# Patient Record
Sex: Male | Born: 1945 | Race: White | Hispanic: No | Marital: Married | Smoking: Current every day smoker
Health system: Southern US, Community
[De-identification: ages and names within clinical notes are randomized; demographics above are authoritative.]

## PROBLEM LIST (undated history)

## (undated) DIAGNOSIS — I219 Acute myocardial infarction, unspecified: Secondary | ICD-10-CM

---

## 2007-09-11 HISTORY — PX: CAROTID STENT: SHX1301

## 2012-02-05 ENCOUNTER — Emergency Department (HOSPITAL_COMMUNITY): Payer: 59

## 2012-02-05 ENCOUNTER — Other Ambulatory Visit: Payer: Self-pay

## 2012-02-05 ENCOUNTER — Emergency Department (HOSPITAL_COMMUNITY)
Admission: EM | Admit: 2012-02-05 | Discharge: 2012-02-05 | Disposition: A | Discharge status: Home / Self Care | Payer: 59 | Attending: Emergency Medicine | Admitting: Emergency Medicine

## 2012-02-05 ENCOUNTER — Encounter (HOSPITAL_COMMUNITY): Payer: Self-pay | Admitting: *Deleted

## 2012-02-05 DIAGNOSIS — I10 Essential (primary) hypertension: Secondary | ICD-10-CM | POA: Insufficient documentation

## 2012-02-05 DIAGNOSIS — R5381 Other malaise: Secondary | ICD-10-CM | POA: Insufficient documentation

## 2012-02-05 DIAGNOSIS — R5383 Other fatigue: Secondary | ICD-10-CM | POA: Insufficient documentation

## 2012-02-05 DIAGNOSIS — Z79899 Other long term (current) drug therapy: Secondary | ICD-10-CM | POA: Insufficient documentation

## 2012-02-05 DIAGNOSIS — I252 Old myocardial infarction: Secondary | ICD-10-CM | POA: Insufficient documentation

## 2012-02-05 DIAGNOSIS — R531 Weakness: Secondary | ICD-10-CM

## 2012-02-05 HISTORY — DX: Acute myocardial infarction, unspecified: I21.9

## 2012-02-05 LAB — COMPREHENSIVE METABOLIC PANEL
BUN: 35 mg/dL — ABNORMAL HIGH (ref 6–23)
Calcium: 9.5 mg/dL (ref 8.4–10.5)
GFR calc Af Amer: 47 mL/min — ABNORMAL LOW (ref >90)
Glucose, Bld: 103 mg/dL — ABNORMAL HIGH (ref 70–99)
Sodium: 137 mEq/L (ref 135–145)
Total Protein: 7.2 g/dL (ref 6.0–8.3)

## 2012-02-05 LAB — TROPONIN I
Troponin I: <0.3 ng/mL (ref <0.30)
Troponin I: <0.3 ng/mL (ref <0.30)

## 2012-02-05 LAB — CBC WITH DIFFERENTIAL/PLATELET
Eosinophils Absolute: 0.3 10*3/uL (ref 0.0–0.7)
Eosinophils Relative: 2 % (ref 0–5)
Lymphs Abs: 1.6 10*3/uL (ref 0.7–4.0)
MCH: 27 pg (ref 26.0–34.0)
MCHC: 32.1 g/dL (ref 30.0–36.0)
MCV: 83.9 fL (ref 78.0–100.0)
Monocytes Relative: 10 % (ref 3–12)
Platelets: 284 10*3/uL (ref 150–400)
RBC: 4.23 MIL/uL (ref 4.22–5.81)

## 2012-02-05 LAB — URINALYSIS, ROUTINE W REFLEX MICROSCOPIC
Nitrite: NEGATIVE
Specific Gravity, Urine: 1.021 (ref 1.005–1.030)
Urobilinogen, UA: 0.2 mg/dL (ref 0.0–1.0)

## 2012-02-05 LAB — OCCULT BLOOD, POC DEVICE: Fecal Occult Bld: NEGATIVE

## 2012-02-05 MED ORDER — SODIUM CHLORIDE 0.9 % IV BOLUS (SEPSIS)
1000.0000 mL | Freq: Once | INTRAVENOUS | Status: AC
  Administered 2012-02-05: 1000 mL via INTRAVENOUS

## 2012-02-05 NOTE — ED Notes (Signed)
Patient was at the parking lot when he felt dizzy, lightheadedness, diaphoretic, nausea, and hypotension, 90/60 per [facility], HR 64, RR 18. Pale and diaphoretic.  Last time he had this symptoms, patient had and MI and stent was placed.  A&O x 3, 20 gauge to LAC, 500 cc of NS fluids, lat BP pta was 110/70.

## 2012-02-05 NOTE — ED Notes (Signed)
Patient back from xray and placed back on monitor.

## 2012-02-05 NOTE — ED Notes (Signed)
Pt ambulatory leaving with wife and friend. Pt leaving with discharge instructions and verbalized understanding of instructions.

## 2012-02-05 NOTE — ED Notes (Signed)
Patient is unable to void at this time 

## 2012-02-05 NOTE — ED Provider Notes (Signed)
History     CSN: [id number]  Arrival date & time 02/05/12  9629   First MD Initiated Contact with Patient 02/05/12 0901      Chief Complaint  Patient presents with  . Near Syncope    (Consider location/radiation/quality/duration/timing/severity/associated sxs/prior treatment) Patient is a 66 y.o. male presenting with weakness. The history is provided by the patient (the pt felt weak and sweaty today). No language interpreter was used.  Weakness Primary symptoms do not include headaches, syncope, loss of consciousness or seizures. The symptoms began 1 to 2 hours ago. The symptoms are improving. The neurological symptoms are diffuse. The symptoms occurred after standing up.  Additional symptoms include weakness. Additional symptoms do not include hallucinations. Medical issues do not include seizures. Workup history does not include CT scan.    Past Medical History  Diagnosis Date  . MI (myocardial infarction)     Past Surgical History  Procedure Date  . Carotid stent 09/2007    History reviewed. No pertinent family history.  History  Substance Use Topics  . Smoking status: Current Everyday Smoker    Types: Cigarettes  . Smokeless tobacco: Not on file  . Alcohol Use: No      Review of Systems  Constitutional: Negative for fatigue.  HENT: Negative for congestion, sinus pressure and ear discharge.   Eyes: Negative for discharge.  Respiratory: Negative for cough.   Cardiovascular: Negative for chest pain and syncope.  Gastrointestinal: Negative for abdominal pain and diarrhea.  Genitourinary: Negative for frequency and hematuria.  Musculoskeletal: Negative for back pain.  Skin: Negative for rash.  Neurological: Positive for weakness. Negative for seizures, loss of consciousness and headaches.  Hematological: Negative.   Psychiatric/Behavioral: Negative for hallucinations.    Allergies  Review of patient's allergies indicates no known allergies.  Home Medications    Current Outpatient Rx  Name Route Sig Dispense Refill  . ASPIRIN EC 81 MG PO TBEC Oral Take 81 mg by mouth at bedtime.    Marland Kitchen CARVEDILOL 12.5 MG PO TABS Oral Take 12.5 mg by mouth 2 (two) times daily with a meal.    . LISINOPRIL 20 MG PO TABS Oral Take 20 mg by mouth daily.    Marland Kitchen LORATADINE 10 MG PO TABS Oral Take 10 mg by mouth daily as needed. For allergies    . ADULT MULTIVITAMIN W/MINERALS CH Oral Take 1 tablet by mouth at bedtime.    Marland Kitchen FISH OIL 1200 MG PO CAPS Oral Take 2 capsules by mouth at bedtime.    . OMEPRAZOLE 20 MG PO CPDR Oral Take 20 mg by mouth 2 (two) times daily.    . PSYLLIUM 0.52 G PO CAPS Oral Take 0.52 g by mouth 2 (two) times daily.    Marland Kitchen RANOLAZINE ER 500 MG PO TB12 Oral Take 1,000 mg by mouth 2 (two) times daily.    Marland Kitchen SIMVASTATIN 40 MG PO TABS Oral Take 40 mg by mouth at bedtime.    . TESTOSTERONE 50 MG/5GM TD GEL Transdermal Place 5 g onto the skin 2 (two) times daily.    . TRAMADOL HCL 50 MG PO TABS Oral Take 50 mg by mouth daily as needed. For pain    . VITAMIN C 500 MG PO TABS Oral Take 500 mg by mouth at bedtime.      BP 125/67  Pulse 68  Temp 97.1 F (36.2 C) (Oral)  Resp 18  SpO2 100%  Physical Exam  Constitutional: He is oriented to person, place, and  time. He appears well-developed.  HENT:  Head: Normocephalic and atraumatic.  Eyes: Conjunctivae and EOM are normal. No scleral icterus.  Neck: Neck supple. No thyromegaly present.  Cardiovascular: Normal rate and regular rhythm.  Exam reveals no gallop and no friction rub.   No murmur heard. Pulmonary/Chest: No stridor. He has no wheezes. He has no rales. He exhibits no tenderness.  Abdominal: He exhibits no distension. There is no tenderness. There is no rebound.  Musculoskeletal: Normal range of motion. He exhibits no edema.  Lymphadenopathy:    He has no cervical adenopathy.  Neurological: He is oriented to person, place, and time. Coordination normal.  Skin: No rash noted. No erythema.    Psychiatric: He has a normal mood and affect. His behavior is normal.    ED Course  Procedures (including critical care time)  Labs Reviewed  CBC WITH DIFFERENTIAL - Abnormal; Notable for the following:    WBC 12.4 (*)     Hemoglobin 11.4 (*)     HCT 35.5 (*)     RDW 17.9 (*)     Neutro Abs 9.2 (*)     Monocytes Absolute 1.2 (*)     All other components within normal limits  COMPREHENSIVE METABOLIC PANEL - Abnormal; Notable for the following:    Potassium 5.4 (*)     Glucose, Bld 103 (*)     BUN 35 (*)     Creatinine, Ser 1.68 (*)     Total Bilirubin 0.2 (*)     GFR calc non Af Amer 41 (*)     GFR calc Af Amer 47 (*)     All other components within normal limits  TROPONIN I  URINALYSIS, ROUTINE W REFLEX MICROSCOPIC  OCCULT BLOOD, POC DEVICE  TROPONIN I   Dg Chest 2 View  02/05/2012  *RADIOLOGY REPORT*  Clinical Data: Near-syncope.  History of hypertension and myocardial infarction.  CHEST - 2 VIEW  Comparison: 09/13/2007 portable chest.  Two-view examination 01/02/2005.  Findings: The heart size and mediastinal contours are stable. There is chronic central airway thickening without hyperinflation or confluent airspace opacity.  There is no pleural effusion. Lower thoracic osteophyte on the lateral view appears unchanged.  IMPRESSION: Stable chronic central airway thickening consistent with bronchitis.  No acute cardiopulmonary process.   Original Report Authenticated By: Gerrianne Scale, M.D.      1. Weakness      Date: 02/05/2012  Rate: 63  Rhythm: normal sinus rhythm  QRS Axis: normal  Intervals: normal  ST/T Wave abnormalities: normal  Conduction Disutrbances:none  Narrative Interpretation:   Old EKG Reviewed: none available    MDM         Benny Lennert, MD 02/05/12 1358

## 2012-02-05 NOTE — ED Notes (Signed)
Patient transported to X-ray 

## 2012-02-05 NOTE — ED Notes (Signed)
Per family, patient "has been losing blood and MD does not know where it is coming from."    Patient is scheduled to have colonoscopy in September.  Patient denies any bloody stools or dark/coffee ground stools.

## 2012-11-15 IMAGING — CR DG CHEST 2V
2 series · 2 of 2 positions shown · non-contrast
Comparison: 09/13/2007 portable chest.  Two-view examination
01/02/2005.

CLINICAL DATA: Near-syncope.  History of hypertension and
myocardial infarction.

CHEST - 2 VIEW

[w chest pa]
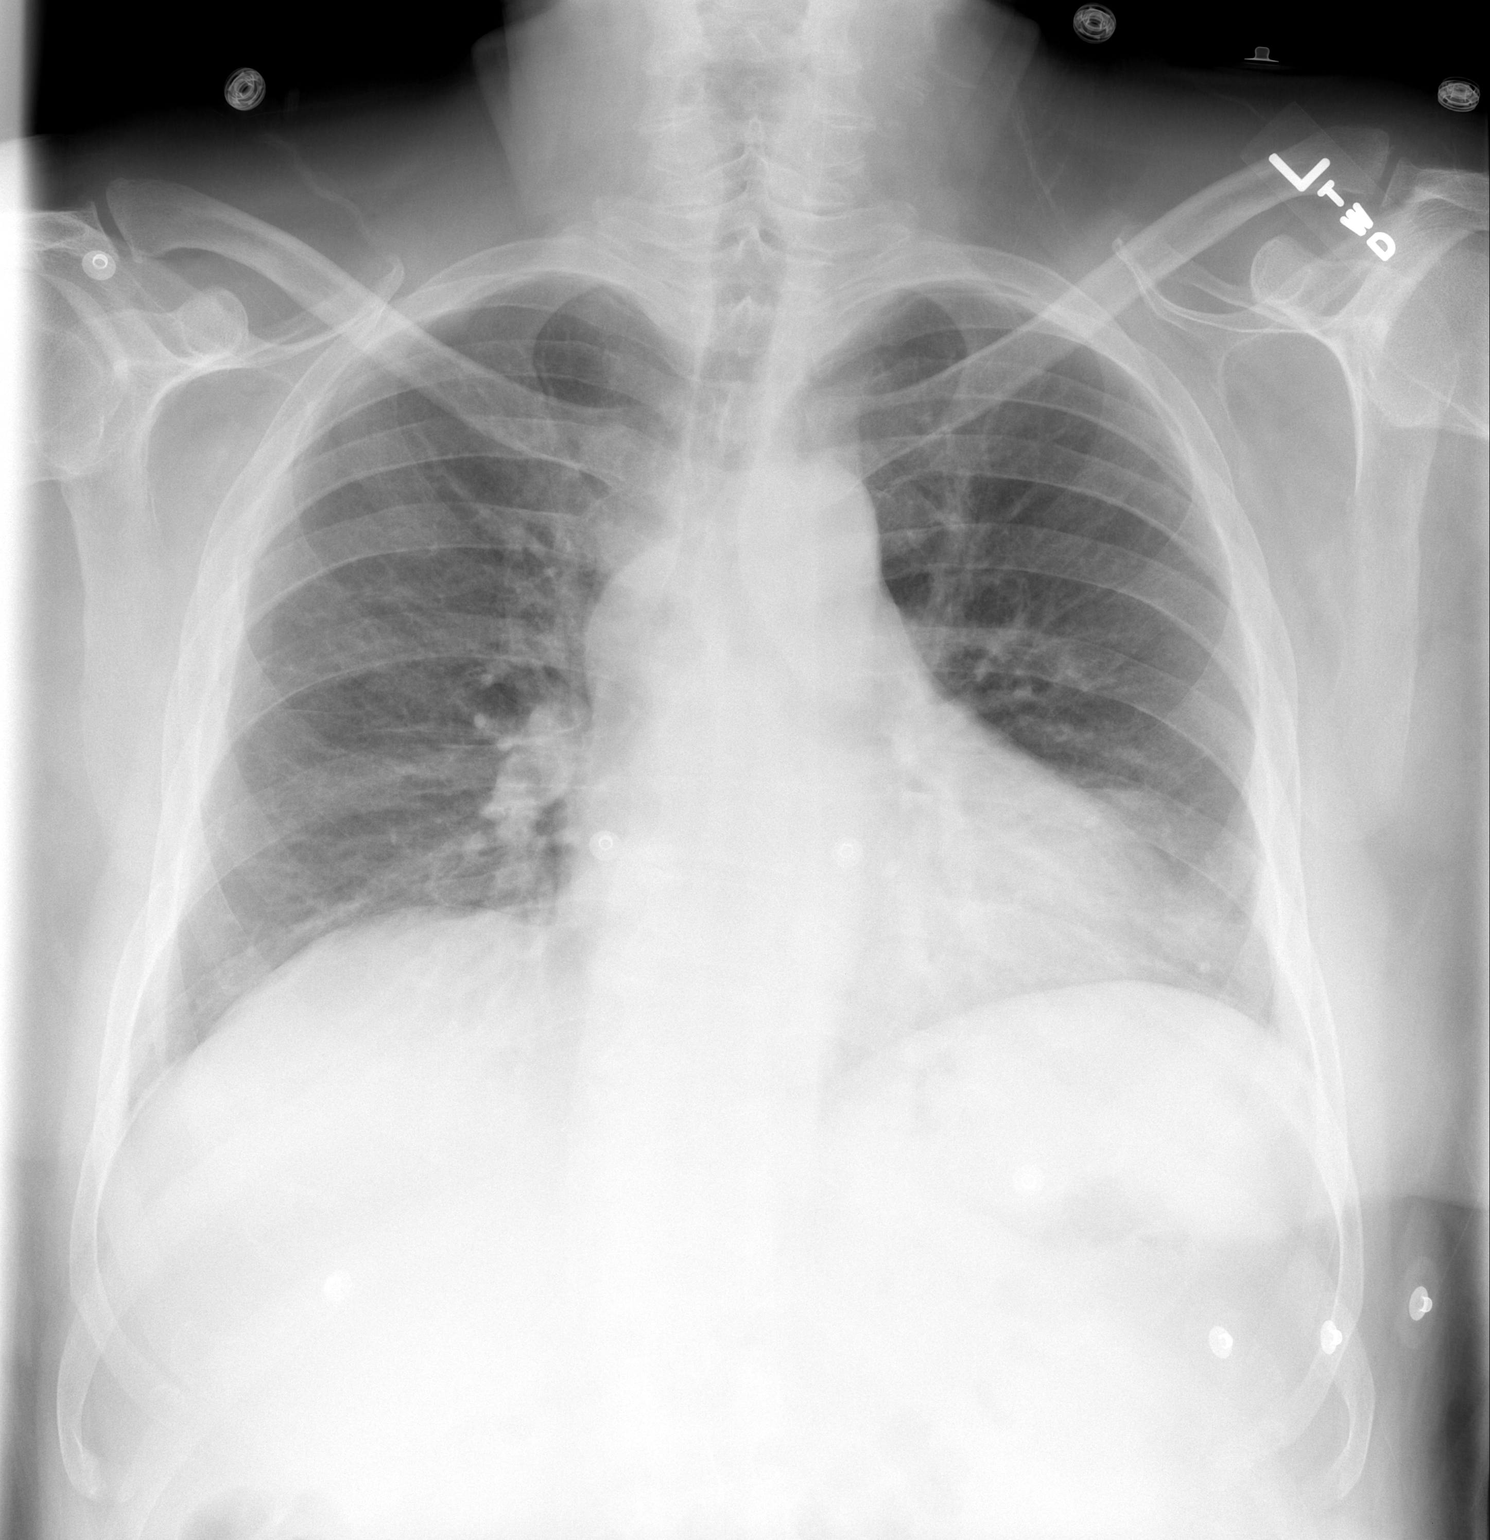

[w chest lat]
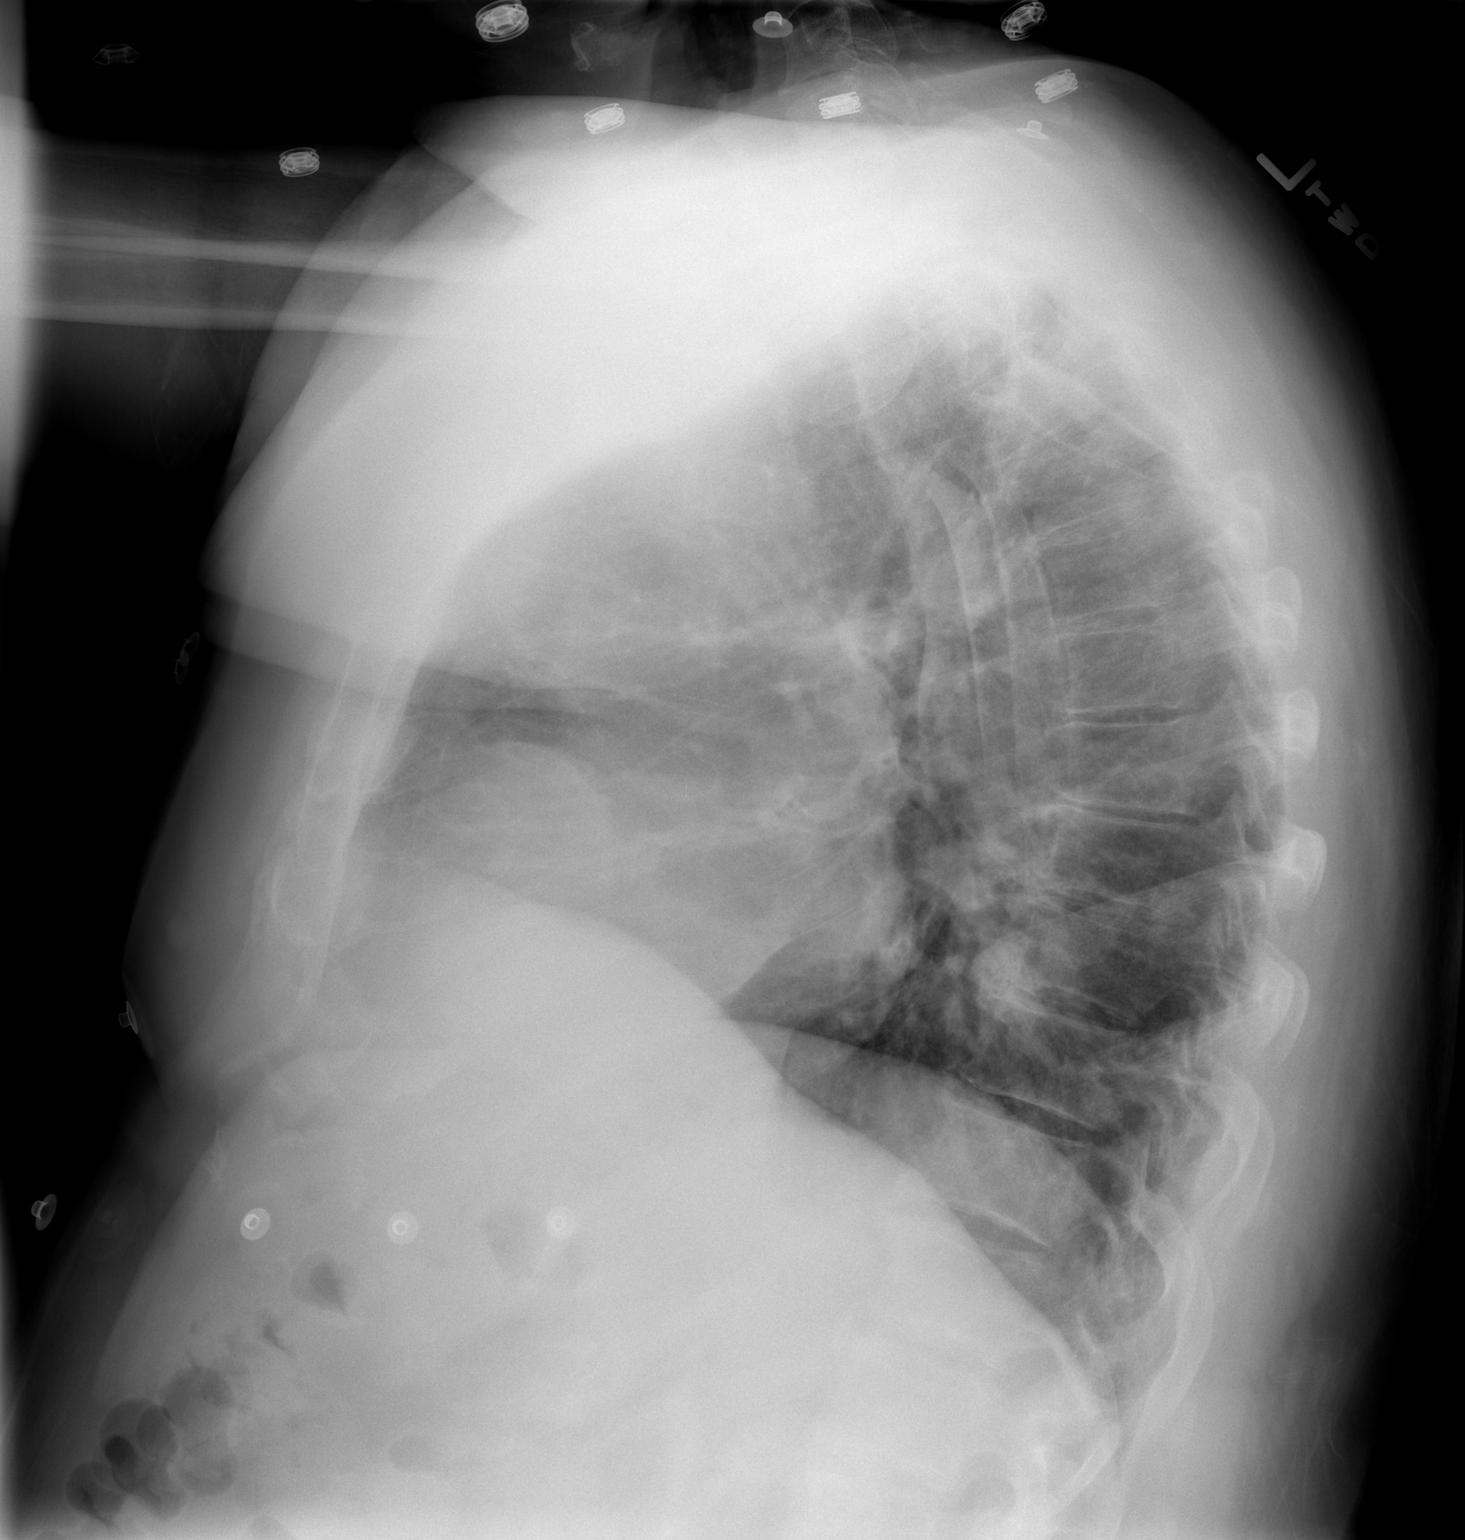

[2 of 2 positions shown; findings below may reference images not displayed]

FINDINGS: The heart size and mediastinal contours are stable.
There is chronic central airway thickening without hyperinflation
or confluent airspace opacity.  There is no pleural effusion.
Lower thoracic osteophyte on the lateral view appears unchanged.
IMPRESSION: Stable chronic central airway thickening consistent with
bronchitis.  No acute cardiopulmonary process.

## 2015-06-18 DIAGNOSIS — E291 Testicular hypofunction: Secondary | ICD-10-CM | POA: Diagnosis not present

## 2015-07-02 DIAGNOSIS — I10 Essential (primary) hypertension: Secondary | ICD-10-CM | POA: Diagnosis not present

## 2015-07-02 DIAGNOSIS — E291 Testicular hypofunction: Secondary | ICD-10-CM | POA: Diagnosis not present

## 2015-07-02 DIAGNOSIS — E785 Hyperlipidemia, unspecified: Secondary | ICD-10-CM | POA: Diagnosis not present

## 2015-07-02 DIAGNOSIS — Z79899 Other long term (current) drug therapy: Secondary | ICD-10-CM | POA: Diagnosis not present

## 2015-07-09 DIAGNOSIS — Z6833 Body mass index (BMI) 33.0-33.9, adult: Secondary | ICD-10-CM | POA: Diagnosis not present

## 2015-07-09 DIAGNOSIS — H25813 Combined forms of age-related cataract, bilateral: Secondary | ICD-10-CM | POA: Diagnosis not present

## 2015-07-09 DIAGNOSIS — E291 Testicular hypofunction: Secondary | ICD-10-CM | POA: Diagnosis not present

## 2015-07-09 DIAGNOSIS — I10 Essential (primary) hypertension: Secondary | ICD-10-CM | POA: Diagnosis not present

## 2015-07-13 DIAGNOSIS — D539 Nutritional anemia, unspecified: Secondary | ICD-10-CM | POA: Diagnosis not present

## 2015-07-13 DIAGNOSIS — R202 Paresthesia of skin: Secondary | ICD-10-CM | POA: Diagnosis not present

## 2015-07-13 DIAGNOSIS — K429 Umbilical hernia without obstruction or gangrene: Secondary | ICD-10-CM | POA: Diagnosis not present

## 2015-07-13 DIAGNOSIS — Z6833 Body mass index (BMI) 33.0-33.9, adult: Secondary | ICD-10-CM | POA: Diagnosis not present

## 2015-07-13 DIAGNOSIS — I10 Essential (primary) hypertension: Secondary | ICD-10-CM | POA: Diagnosis not present

## 2015-07-21 DIAGNOSIS — Z86010 Personal history of colonic polyps: Secondary | ICD-10-CM | POA: Insufficient documentation

## 2015-07-21 DIAGNOSIS — I2510 Atherosclerotic heart disease of native coronary artery without angina pectoris: Secondary | ICD-10-CM | POA: Insufficient documentation

## 2015-07-21 DIAGNOSIS — K219 Gastro-esophageal reflux disease without esophagitis: Secondary | ICD-10-CM | POA: Insufficient documentation

## 2015-07-21 DIAGNOSIS — Z87898 Personal history of other specified conditions: Secondary | ICD-10-CM | POA: Insufficient documentation

## 2015-07-21 DIAGNOSIS — D500 Iron deficiency anemia secondary to blood loss (chronic): Secondary | ICD-10-CM | POA: Diagnosis not present

## 2015-07-21 DIAGNOSIS — Z8774 Personal history of (corrected) congenital malformations of heart and circulatory system: Secondary | ICD-10-CM | POA: Insufficient documentation

## 2015-07-21 DIAGNOSIS — E7800 Pure hypercholesterolemia, unspecified: Secondary | ICD-10-CM | POA: Insufficient documentation

## 2015-07-21 DIAGNOSIS — I10 Essential (primary) hypertension: Secondary | ICD-10-CM | POA: Insufficient documentation

## 2015-07-21 DIAGNOSIS — D649 Anemia, unspecified: Secondary | ICD-10-CM | POA: Insufficient documentation

## 2015-07-21 DIAGNOSIS — Z860101 Personal history of adenomatous and serrated colon polyps: Secondary | ICD-10-CM | POA: Insufficient documentation

## 2015-07-21 DIAGNOSIS — R0602 Shortness of breath: Secondary | ICD-10-CM | POA: Insufficient documentation

## 2015-07-21 DIAGNOSIS — Z8719 Personal history of other diseases of the digestive system: Secondary | ICD-10-CM | POA: Diagnosis not present

## 2015-07-22 DIAGNOSIS — K420 Umbilical hernia with obstruction, without gangrene: Secondary | ICD-10-CM | POA: Insufficient documentation

## 2015-07-30 DIAGNOSIS — E291 Testicular hypofunction: Secondary | ICD-10-CM | POA: Diagnosis not present

## 2015-08-13 DIAGNOSIS — D539 Nutritional anemia, unspecified: Secondary | ICD-10-CM | POA: Diagnosis not present

## 2015-08-13 DIAGNOSIS — J449 Chronic obstructive pulmonary disease, unspecified: Secondary | ICD-10-CM | POA: Diagnosis not present

## 2015-08-13 DIAGNOSIS — E291 Testicular hypofunction: Secondary | ICD-10-CM | POA: Diagnosis not present

## 2015-08-13 DIAGNOSIS — Z01810 Encounter for preprocedural cardiovascular examination: Secondary | ICD-10-CM | POA: Diagnosis not present

## 2015-08-13 DIAGNOSIS — J309 Allergic rhinitis, unspecified: Secondary | ICD-10-CM | POA: Diagnosis not present

## 2015-08-13 DIAGNOSIS — F17200 Nicotine dependence, unspecified, uncomplicated: Secondary | ICD-10-CM | POA: Diagnosis not present

## 2015-08-13 DIAGNOSIS — Z01818 Encounter for other preprocedural examination: Secondary | ICD-10-CM | POA: Diagnosis not present

## 2015-08-13 DIAGNOSIS — I10 Essential (primary) hypertension: Secondary | ICD-10-CM | POA: Diagnosis not present

## 2015-08-13 DIAGNOSIS — Z6833 Body mass index (BMI) 33.0-33.9, adult: Secondary | ICD-10-CM | POA: Diagnosis not present

## 2015-08-13 DIAGNOSIS — I2510 Atherosclerotic heart disease of native coronary artery without angina pectoris: Secondary | ICD-10-CM | POA: Diagnosis not present

## 2015-08-16 DIAGNOSIS — Z7982 Long term (current) use of aspirin: Secondary | ICD-10-CM | POA: Insufficient documentation

## 2015-08-18 DIAGNOSIS — I2510 Atherosclerotic heart disease of native coronary artery without angina pectoris: Secondary | ICD-10-CM | POA: Diagnosis not present

## 2015-08-18 DIAGNOSIS — E7800 Pure hypercholesterolemia, unspecified: Secondary | ICD-10-CM | POA: Diagnosis not present

## 2015-08-18 DIAGNOSIS — I10 Essential (primary) hypertension: Secondary | ICD-10-CM | POA: Diagnosis not present

## 2015-08-18 DIAGNOSIS — Z7982 Long term (current) use of aspirin: Secondary | ICD-10-CM | POA: Diagnosis not present

## 2015-08-20 DIAGNOSIS — I10 Essential (primary) hypertension: Secondary | ICD-10-CM | POA: Diagnosis not present

## 2015-08-20 DIAGNOSIS — E7800 Pure hypercholesterolemia, unspecified: Secondary | ICD-10-CM | POA: Diagnosis not present

## 2015-08-20 DIAGNOSIS — Z7982 Long term (current) use of aspirin: Secondary | ICD-10-CM | POA: Diagnosis not present

## 2015-08-20 DIAGNOSIS — I2510 Atherosclerotic heart disease of native coronary artery without angina pectoris: Secondary | ICD-10-CM | POA: Diagnosis not present

## 2015-08-23 DIAGNOSIS — I2510 Atherosclerotic heart disease of native coronary artery without angina pectoris: Secondary | ICD-10-CM | POA: Diagnosis not present

## 2015-08-23 DIAGNOSIS — Z01818 Encounter for other preprocedural examination: Secondary | ICD-10-CM | POA: Diagnosis not present

## 2015-08-23 DIAGNOSIS — Z01810 Encounter for preprocedural cardiovascular examination: Secondary | ICD-10-CM | POA: Diagnosis not present

## 2015-08-23 DIAGNOSIS — R079 Chest pain, unspecified: Secondary | ICD-10-CM | POA: Diagnosis not present

## 2015-09-01 DIAGNOSIS — K420 Umbilical hernia with obstruction, without gangrene: Secondary | ICD-10-CM | POA: Diagnosis not present

## 2015-09-03 DIAGNOSIS — E291 Testicular hypofunction: Secondary | ICD-10-CM | POA: Diagnosis not present

## 2015-09-06 DIAGNOSIS — I10 Essential (primary) hypertension: Secondary | ICD-10-CM | POA: Diagnosis not present

## 2015-09-06 DIAGNOSIS — Z79899 Other long term (current) drug therapy: Secondary | ICD-10-CM | POA: Diagnosis not present

## 2015-09-06 DIAGNOSIS — I2510 Atherosclerotic heart disease of native coronary artery without angina pectoris: Secondary | ICD-10-CM | POA: Diagnosis not present

## 2015-09-06 DIAGNOSIS — K429 Umbilical hernia without obstruction or gangrene: Secondary | ICD-10-CM | POA: Diagnosis not present

## 2015-09-06 DIAGNOSIS — E785 Hyperlipidemia, unspecified: Secondary | ICD-10-CM | POA: Diagnosis not present

## 2015-09-06 DIAGNOSIS — E119 Type 2 diabetes mellitus without complications: Secondary | ICD-10-CM | POA: Diagnosis not present

## 2015-09-06 DIAGNOSIS — K420 Umbilical hernia with obstruction, without gangrene: Secondary | ICD-10-CM | POA: Diagnosis not present

## 2015-09-06 DIAGNOSIS — K219 Gastro-esophageal reflux disease without esophagitis: Secondary | ICD-10-CM | POA: Diagnosis not present

## 2015-09-30 DIAGNOSIS — E291 Testicular hypofunction: Secondary | ICD-10-CM | POA: Diagnosis not present

## 2015-10-22 DIAGNOSIS — E291 Testicular hypofunction: Secondary | ICD-10-CM | POA: Diagnosis not present

## 2015-10-29 DIAGNOSIS — R062 Wheezing: Secondary | ICD-10-CM | POA: Diagnosis not present

## 2015-10-29 DIAGNOSIS — R0602 Shortness of breath: Secondary | ICD-10-CM | POA: Diagnosis not present

## 2015-11-01 DIAGNOSIS — R233 Spontaneous ecchymoses: Secondary | ICD-10-CM | POA: Diagnosis not present

## 2015-11-01 DIAGNOSIS — L853 Xerosis cutis: Secondary | ICD-10-CM | POA: Diagnosis not present

## 2015-11-01 DIAGNOSIS — L820 Inflamed seborrheic keratosis: Secondary | ICD-10-CM | POA: Diagnosis not present

## 2015-11-05 DIAGNOSIS — E291 Testicular hypofunction: Secondary | ICD-10-CM | POA: Diagnosis not present

## 2015-11-05 DIAGNOSIS — R7309 Other abnormal glucose: Secondary | ICD-10-CM | POA: Diagnosis not present

## 2015-11-05 DIAGNOSIS — I10 Essential (primary) hypertension: Secondary | ICD-10-CM | POA: Diagnosis not present

## 2015-11-05 DIAGNOSIS — E785 Hyperlipidemia, unspecified: Secondary | ICD-10-CM | POA: Diagnosis not present

## 2015-11-05 DIAGNOSIS — D539 Nutritional anemia, unspecified: Secondary | ICD-10-CM | POA: Diagnosis not present

## 2015-11-12 DIAGNOSIS — Z6832 Body mass index (BMI) 32.0-32.9, adult: Secondary | ICD-10-CM | POA: Diagnosis not present

## 2015-11-12 DIAGNOSIS — E291 Testicular hypofunction: Secondary | ICD-10-CM | POA: Diagnosis not present

## 2015-11-12 DIAGNOSIS — J449 Chronic obstructive pulmonary disease, unspecified: Secondary | ICD-10-CM | POA: Diagnosis not present

## 2015-11-12 DIAGNOSIS — R7303 Prediabetes: Secondary | ICD-10-CM | POA: Diagnosis not present

## 2015-11-12 DIAGNOSIS — E785 Hyperlipidemia, unspecified: Secondary | ICD-10-CM | POA: Diagnosis not present

## 2015-11-12 DIAGNOSIS — I10 Essential (primary) hypertension: Secondary | ICD-10-CM | POA: Diagnosis not present

## 2015-11-12 DIAGNOSIS — D539 Nutritional anemia, unspecified: Secondary | ICD-10-CM | POA: Diagnosis not present

## 2015-11-26 DIAGNOSIS — J0100 Acute maxillary sinusitis, unspecified: Secondary | ICD-10-CM | POA: Diagnosis not present

## 2015-12-03 DIAGNOSIS — E291 Testicular hypofunction: Secondary | ICD-10-CM | POA: Diagnosis not present

## 2015-12-24 DIAGNOSIS — E291 Testicular hypofunction: Secondary | ICD-10-CM | POA: Diagnosis not present

## 2015-12-30 DIAGNOSIS — J159 Unspecified bacterial pneumonia: Secondary | ICD-10-CM | POA: Diagnosis not present

## 2015-12-30 DIAGNOSIS — J309 Allergic rhinitis, unspecified: Secondary | ICD-10-CM | POA: Diagnosis not present

## 2015-12-30 DIAGNOSIS — Z6832 Body mass index (BMI) 32.0-32.9, adult: Secondary | ICD-10-CM | POA: Diagnosis not present

## 2015-12-30 DIAGNOSIS — E669 Obesity, unspecified: Secondary | ICD-10-CM | POA: Diagnosis not present

## 2015-12-30 DIAGNOSIS — Z139 Encounter for screening, unspecified: Secondary | ICD-10-CM | POA: Diagnosis not present

## 2015-12-30 DIAGNOSIS — Z9181 History of falling: Secondary | ICD-10-CM | POA: Diagnosis not present

## 2015-12-30 DIAGNOSIS — J189 Pneumonia, unspecified organism: Secondary | ICD-10-CM | POA: Diagnosis not present

## 2015-12-30 DIAGNOSIS — Z1389 Encounter for screening for other disorder: Secondary | ICD-10-CM | POA: Diagnosis not present

## 2016-01-14 DIAGNOSIS — E291 Testicular hypofunction: Secondary | ICD-10-CM | POA: Diagnosis not present

## 2016-01-28 DIAGNOSIS — H25813 Combined forms of age-related cataract, bilateral: Secondary | ICD-10-CM | POA: Diagnosis not present

## 2016-02-04 DIAGNOSIS — E291 Testicular hypofunction: Secondary | ICD-10-CM | POA: Diagnosis not present

## 2016-02-11 DIAGNOSIS — I10 Essential (primary) hypertension: Secondary | ICD-10-CM | POA: Diagnosis not present

## 2016-02-11 DIAGNOSIS — E785 Hyperlipidemia, unspecified: Secondary | ICD-10-CM | POA: Diagnosis not present

## 2016-02-11 DIAGNOSIS — R739 Hyperglycemia, unspecified: Secondary | ICD-10-CM | POA: Diagnosis not present

## 2016-02-11 DIAGNOSIS — D539 Nutritional anemia, unspecified: Secondary | ICD-10-CM | POA: Diagnosis not present

## 2016-02-11 DIAGNOSIS — E291 Testicular hypofunction: Secondary | ICD-10-CM | POA: Diagnosis not present

## 2016-02-18 DIAGNOSIS — E785 Hyperlipidemia, unspecified: Secondary | ICD-10-CM | POA: Diagnosis not present

## 2016-02-18 DIAGNOSIS — G2581 Restless legs syndrome: Secondary | ICD-10-CM | POA: Diagnosis not present

## 2016-02-18 DIAGNOSIS — M1288 Other specific arthropathies, not elsewhere classified, other specified site: Secondary | ICD-10-CM | POA: Diagnosis not present

## 2016-02-18 DIAGNOSIS — M4726 Other spondylosis with radiculopathy, lumbar region: Secondary | ICD-10-CM | POA: Diagnosis not present

## 2016-02-18 DIAGNOSIS — M5416 Radiculopathy, lumbar region: Secondary | ICD-10-CM | POA: Diagnosis not present

## 2016-02-18 DIAGNOSIS — I10 Essential (primary) hypertension: Secondary | ICD-10-CM | POA: Diagnosis not present

## 2016-02-18 DIAGNOSIS — M4186 Other forms of scoliosis, lumbar region: Secondary | ICD-10-CM | POA: Diagnosis not present

## 2016-02-18 DIAGNOSIS — E291 Testicular hypofunction: Secondary | ICD-10-CM | POA: Diagnosis not present

## 2016-02-18 DIAGNOSIS — J449 Chronic obstructive pulmonary disease, unspecified: Secondary | ICD-10-CM | POA: Diagnosis not present

## 2016-02-29 DIAGNOSIS — L820 Inflamed seborrheic keratosis: Secondary | ICD-10-CM | POA: Diagnosis not present

## 2016-02-29 DIAGNOSIS — D485 Neoplasm of uncertain behavior of skin: Secondary | ICD-10-CM | POA: Diagnosis not present

## 2016-03-03 DIAGNOSIS — M5416 Radiculopathy, lumbar region: Secondary | ICD-10-CM | POA: Diagnosis not present

## 2016-03-03 DIAGNOSIS — Z01818 Encounter for other preprocedural examination: Secondary | ICD-10-CM | POA: Diagnosis not present

## 2016-03-04 DIAGNOSIS — M5416 Radiculopathy, lumbar region: Secondary | ICD-10-CM | POA: Diagnosis not present

## 2016-03-04 DIAGNOSIS — M4806 Spinal stenosis, lumbar region: Secondary | ICD-10-CM | POA: Diagnosis not present

## 2016-03-04 DIAGNOSIS — M545 Low back pain: Secondary | ICD-10-CM | POA: Diagnosis not present

## 2016-03-16 DIAGNOSIS — Z720 Tobacco use: Secondary | ICD-10-CM | POA: Diagnosis not present

## 2016-03-16 DIAGNOSIS — Z7982 Long term (current) use of aspirin: Secondary | ICD-10-CM | POA: Diagnosis not present

## 2016-03-16 DIAGNOSIS — I10 Essential (primary) hypertension: Secondary | ICD-10-CM | POA: Diagnosis not present

## 2016-03-16 DIAGNOSIS — I2510 Atherosclerotic heart disease of native coronary artery without angina pectoris: Secondary | ICD-10-CM | POA: Diagnosis not present

## 2016-03-16 DIAGNOSIS — E7800 Pure hypercholesterolemia, unspecified: Secondary | ICD-10-CM | POA: Diagnosis not present

## 2016-03-16 DIAGNOSIS — I350 Nonrheumatic aortic (valve) stenosis: Secondary | ICD-10-CM | POA: Diagnosis not present

## 2016-03-16 DIAGNOSIS — Z01810 Encounter for preprocedural cardiovascular examination: Secondary | ICD-10-CM | POA: Insufficient documentation

## 2016-03-17 DIAGNOSIS — M546 Pain in thoracic spine: Secondary | ICD-10-CM | POA: Diagnosis not present

## 2016-03-17 DIAGNOSIS — M5136 Other intervertebral disc degeneration, lumbar region: Secondary | ICD-10-CM | POA: Diagnosis not present

## 2016-03-17 DIAGNOSIS — M47816 Spondylosis without myelopathy or radiculopathy, lumbar region: Secondary | ICD-10-CM | POA: Diagnosis not present

## 2016-03-17 DIAGNOSIS — M4316 Spondylolisthesis, lumbar region: Secondary | ICD-10-CM | POA: Diagnosis not present

## 2016-03-17 DIAGNOSIS — Z23 Encounter for immunization: Secondary | ICD-10-CM | POA: Diagnosis not present

## 2016-03-17 DIAGNOSIS — M48062 Spinal stenosis, lumbar region with neurogenic claudication: Secondary | ICD-10-CM | POA: Diagnosis not present

## 2016-03-17 DIAGNOSIS — M545 Low back pain: Secondary | ICD-10-CM | POA: Diagnosis not present

## 2016-04-06 DIAGNOSIS — M48061 Spinal stenosis, lumbar region without neurogenic claudication: Secondary | ICD-10-CM | POA: Diagnosis not present

## 2016-04-06 DIAGNOSIS — M4726 Other spondylosis with radiculopathy, lumbar region: Secondary | ICD-10-CM | POA: Diagnosis not present

## 2016-04-06 DIAGNOSIS — M5136 Other intervertebral disc degeneration, lumbar region: Secondary | ICD-10-CM | POA: Diagnosis not present

## 2016-04-25 DIAGNOSIS — H18413 Arcus senilis, bilateral: Secondary | ICD-10-CM | POA: Diagnosis not present

## 2016-04-25 DIAGNOSIS — H2511 Age-related nuclear cataract, right eye: Secondary | ICD-10-CM | POA: Diagnosis not present

## 2016-04-25 DIAGNOSIS — H2513 Age-related nuclear cataract, bilateral: Secondary | ICD-10-CM | POA: Diagnosis not present

## 2016-04-25 DIAGNOSIS — H25013 Cortical age-related cataract, bilateral: Secondary | ICD-10-CM | POA: Diagnosis not present

## 2016-04-25 DIAGNOSIS — H25043 Posterior subcapsular polar age-related cataract, bilateral: Secondary | ICD-10-CM | POA: Diagnosis not present

## 2016-05-19 DIAGNOSIS — E785 Hyperlipidemia, unspecified: Secondary | ICD-10-CM | POA: Diagnosis not present

## 2016-05-19 DIAGNOSIS — I10 Essential (primary) hypertension: Secondary | ICD-10-CM | POA: Diagnosis not present

## 2016-05-19 DIAGNOSIS — R7303 Prediabetes: Secondary | ICD-10-CM | POA: Diagnosis not present

## 2016-05-19 DIAGNOSIS — D539 Nutritional anemia, unspecified: Secondary | ICD-10-CM | POA: Diagnosis not present

## 2016-05-19 DIAGNOSIS — E349 Endocrine disorder, unspecified: Secondary | ICD-10-CM | POA: Diagnosis not present

## 2016-05-19 DIAGNOSIS — Z125 Encounter for screening for malignant neoplasm of prostate: Secondary | ICD-10-CM | POA: Diagnosis not present

## 2016-05-26 DIAGNOSIS — D485 Neoplasm of uncertain behavior of skin: Secondary | ICD-10-CM | POA: Diagnosis not present

## 2016-05-26 DIAGNOSIS — L821 Other seborrheic keratosis: Secondary | ICD-10-CM | POA: Diagnosis not present

## 2016-05-26 DIAGNOSIS — L570 Actinic keratosis: Secondary | ICD-10-CM | POA: Diagnosis not present

## 2016-06-02 DIAGNOSIS — M4316 Spondylolisthesis, lumbar region: Secondary | ICD-10-CM | POA: Diagnosis not present

## 2016-06-02 DIAGNOSIS — M48062 Spinal stenosis, lumbar region with neurogenic claudication: Secondary | ICD-10-CM | POA: Diagnosis not present

## 2016-06-02 DIAGNOSIS — M5136 Other intervertebral disc degeneration, lumbar region: Secondary | ICD-10-CM | POA: Diagnosis not present

## 2016-06-02 DIAGNOSIS — M47816 Spondylosis without myelopathy or radiculopathy, lumbar region: Secondary | ICD-10-CM | POA: Diagnosis not present

## 2016-06-19 DIAGNOSIS — H2511 Age-related nuclear cataract, right eye: Secondary | ICD-10-CM | POA: Diagnosis not present

## 2016-06-19 DIAGNOSIS — H25811 Combined forms of age-related cataract, right eye: Secondary | ICD-10-CM | POA: Diagnosis not present

## 2016-06-20 DIAGNOSIS — H2512 Age-related nuclear cataract, left eye: Secondary | ICD-10-CM | POA: Diagnosis not present

## 2016-07-03 DIAGNOSIS — H25812 Combined forms of age-related cataract, left eye: Secondary | ICD-10-CM | POA: Diagnosis not present

## 2016-07-03 DIAGNOSIS — H2512 Age-related nuclear cataract, left eye: Secondary | ICD-10-CM | POA: Diagnosis not present

## 2016-07-28 DIAGNOSIS — L570 Actinic keratosis: Secondary | ICD-10-CM | POA: Diagnosis not present

## 2016-07-28 DIAGNOSIS — L578 Other skin changes due to chronic exposure to nonionizing radiation: Secondary | ICD-10-CM | POA: Diagnosis not present

## 2016-07-28 DIAGNOSIS — L821 Other seborrheic keratosis: Secondary | ICD-10-CM | POA: Diagnosis not present

## 2016-08-11 DIAGNOSIS — K648 Other hemorrhoids: Secondary | ICD-10-CM | POA: Diagnosis not present

## 2016-08-11 DIAGNOSIS — K649 Unspecified hemorrhoids: Secondary | ICD-10-CM | POA: Diagnosis not present

## 2016-08-11 DIAGNOSIS — Z86010 Personal history of colonic polyps: Secondary | ICD-10-CM | POA: Diagnosis not present

## 2016-08-11 DIAGNOSIS — K5730 Diverticulosis of large intestine without perforation or abscess without bleeding: Secondary | ICD-10-CM | POA: Diagnosis not present

## 2016-08-11 DIAGNOSIS — K621 Rectal polyp: Secondary | ICD-10-CM | POA: Diagnosis not present

## 2016-08-11 DIAGNOSIS — Z1211 Encounter for screening for malignant neoplasm of colon: Secondary | ICD-10-CM | POA: Diagnosis not present

## 2016-08-11 DIAGNOSIS — D122 Benign neoplasm of ascending colon: Secondary | ICD-10-CM | POA: Diagnosis not present

## 2016-08-18 DIAGNOSIS — E785 Hyperlipidemia, unspecified: Secondary | ICD-10-CM | POA: Diagnosis not present

## 2016-08-18 DIAGNOSIS — D539 Nutritional anemia, unspecified: Secondary | ICD-10-CM | POA: Diagnosis not present

## 2016-08-18 DIAGNOSIS — I10 Essential (primary) hypertension: Secondary | ICD-10-CM | POA: Diagnosis not present

## 2016-08-18 DIAGNOSIS — R7303 Prediabetes: Secondary | ICD-10-CM | POA: Diagnosis not present

## 2016-08-25 DIAGNOSIS — E785 Hyperlipidemia, unspecified: Secondary | ICD-10-CM | POA: Diagnosis not present

## 2016-08-25 DIAGNOSIS — J449 Chronic obstructive pulmonary disease, unspecified: Secondary | ICD-10-CM | POA: Diagnosis not present

## 2016-08-25 DIAGNOSIS — G2581 Restless legs syndrome: Secondary | ICD-10-CM | POA: Diagnosis not present

## 2016-08-25 DIAGNOSIS — I10 Essential (primary) hypertension: Secondary | ICD-10-CM | POA: Diagnosis not present

## 2016-08-25 DIAGNOSIS — J019 Acute sinusitis, unspecified: Secondary | ICD-10-CM | POA: Diagnosis not present

## 2016-08-25 DIAGNOSIS — J309 Allergic rhinitis, unspecified: Secondary | ICD-10-CM | POA: Diagnosis not present

## 2016-08-25 DIAGNOSIS — R7303 Prediabetes: Secondary | ICD-10-CM | POA: Diagnosis not present

## 2016-08-31 DIAGNOSIS — M5136 Other intervertebral disc degeneration, lumbar region: Secondary | ICD-10-CM | POA: Diagnosis not present

## 2016-08-31 DIAGNOSIS — M4726 Other spondylosis with radiculopathy, lumbar region: Secondary | ICD-10-CM | POA: Diagnosis not present

## 2016-08-31 DIAGNOSIS — M48061 Spinal stenosis, lumbar region without neurogenic claudication: Secondary | ICD-10-CM | POA: Diagnosis not present

## 2016-09-14 DIAGNOSIS — Z7982 Long term (current) use of aspirin: Secondary | ICD-10-CM | POA: Diagnosis not present

## 2016-09-14 DIAGNOSIS — I10 Essential (primary) hypertension: Secondary | ICD-10-CM | POA: Diagnosis not present

## 2016-09-14 DIAGNOSIS — I350 Nonrheumatic aortic (valve) stenosis: Secondary | ICD-10-CM | POA: Diagnosis not present

## 2016-09-14 DIAGNOSIS — E7800 Pure hypercholesterolemia, unspecified: Secondary | ICD-10-CM | POA: Diagnosis not present

## 2016-09-14 DIAGNOSIS — I2510 Atherosclerotic heart disease of native coronary artery without angina pectoris: Secondary | ICD-10-CM | POA: Diagnosis not present

## 2016-09-14 DIAGNOSIS — Z720 Tobacco use: Secondary | ICD-10-CM | POA: Diagnosis not present

## 2016-10-13 DIAGNOSIS — H47012 Ischemic optic neuropathy, left eye: Secondary | ICD-10-CM | POA: Diagnosis not present

## 2016-10-17 DIAGNOSIS — H47012 Ischemic optic neuropathy, left eye: Secondary | ICD-10-CM | POA: Diagnosis not present

## 2016-10-17 DIAGNOSIS — H43813 Vitreous degeneration, bilateral: Secondary | ICD-10-CM | POA: Diagnosis not present

## 2016-10-24 DIAGNOSIS — M47816 Spondylosis without myelopathy or radiculopathy, lumbar region: Secondary | ICD-10-CM | POA: Diagnosis not present

## 2016-10-24 DIAGNOSIS — M5136 Other intervertebral disc degeneration, lumbar region: Secondary | ICD-10-CM | POA: Diagnosis not present

## 2016-10-24 DIAGNOSIS — M4316 Spondylolisthesis, lumbar region: Secondary | ICD-10-CM | POA: Diagnosis not present

## 2016-10-24 DIAGNOSIS — M48062 Spinal stenosis, lumbar region with neurogenic claudication: Secondary | ICD-10-CM | POA: Diagnosis not present

## 2016-10-28 DIAGNOSIS — M79672 Pain in left foot: Secondary | ICD-10-CM | POA: Diagnosis not present

## 2016-11-03 DIAGNOSIS — S93602A Unspecified sprain of left foot, initial encounter: Secondary | ICD-10-CM | POA: Diagnosis not present

## 2016-11-03 DIAGNOSIS — M25572 Pain in left ankle and joints of left foot: Secondary | ICD-10-CM | POA: Diagnosis not present

## 2016-11-17 DIAGNOSIS — S93692D Other sprain of left foot, subsequent encounter: Secondary | ICD-10-CM | POA: Diagnosis not present

## 2016-11-17 DIAGNOSIS — M25572 Pain in left ankle and joints of left foot: Secondary | ICD-10-CM | POA: Diagnosis not present

## 2016-11-28 DIAGNOSIS — M5136 Other intervertebral disc degeneration, lumbar region: Secondary | ICD-10-CM | POA: Diagnosis not present

## 2016-11-28 DIAGNOSIS — M48061 Spinal stenosis, lumbar region without neurogenic claudication: Secondary | ICD-10-CM | POA: Diagnosis not present

## 2016-11-29 DIAGNOSIS — R7303 Prediabetes: Secondary | ICD-10-CM | POA: Diagnosis not present

## 2016-11-29 DIAGNOSIS — E785 Hyperlipidemia, unspecified: Secondary | ICD-10-CM | POA: Diagnosis not present

## 2016-11-29 DIAGNOSIS — I10 Essential (primary) hypertension: Secondary | ICD-10-CM | POA: Diagnosis not present

## 2016-11-29 DIAGNOSIS — B029 Zoster without complications: Secondary | ICD-10-CM | POA: Diagnosis not present

## 2016-12-08 DIAGNOSIS — G2581 Restless legs syndrome: Secondary | ICD-10-CM | POA: Diagnosis not present

## 2016-12-08 DIAGNOSIS — D539 Nutritional anemia, unspecified: Secondary | ICD-10-CM | POA: Diagnosis not present

## 2016-12-08 DIAGNOSIS — I10 Essential (primary) hypertension: Secondary | ICD-10-CM | POA: Diagnosis not present

## 2016-12-08 DIAGNOSIS — E785 Hyperlipidemia, unspecified: Secondary | ICD-10-CM | POA: Diagnosis not present

## 2016-12-08 DIAGNOSIS — R7303 Prediabetes: Secondary | ICD-10-CM | POA: Diagnosis not present

## 2016-12-08 DIAGNOSIS — J449 Chronic obstructive pulmonary disease, unspecified: Secondary | ICD-10-CM | POA: Diagnosis not present

## 2016-12-12 DIAGNOSIS — Z1389 Encounter for screening for other disorder: Secondary | ICD-10-CM | POA: Diagnosis not present

## 2016-12-12 DIAGNOSIS — E785 Hyperlipidemia, unspecified: Secondary | ICD-10-CM | POA: Diagnosis not present

## 2016-12-12 DIAGNOSIS — Z9181 History of falling: Secondary | ICD-10-CM | POA: Diagnosis not present

## 2016-12-12 DIAGNOSIS — Z6832 Body mass index (BMI) 32.0-32.9, adult: Secondary | ICD-10-CM | POA: Diagnosis not present

## 2016-12-12 DIAGNOSIS — Z125 Encounter for screening for malignant neoplasm of prostate: Secondary | ICD-10-CM | POA: Diagnosis not present

## 2016-12-12 DIAGNOSIS — Z0000 Encounter for general adult medical examination without abnormal findings: Secondary | ICD-10-CM | POA: Diagnosis not present

## 2016-12-12 DIAGNOSIS — Z136 Encounter for screening for cardiovascular disorders: Secondary | ICD-10-CM | POA: Diagnosis not present

## 2016-12-12 DIAGNOSIS — Z139 Encounter for screening, unspecified: Secondary | ICD-10-CM | POA: Diagnosis not present

## 2017-01-11 DIAGNOSIS — M5136 Other intervertebral disc degeneration, lumbar region: Secondary | ICD-10-CM | POA: Diagnosis not present

## 2017-01-11 DIAGNOSIS — M48061 Spinal stenosis, lumbar region without neurogenic claudication: Secondary | ICD-10-CM | POA: Diagnosis not present

## 2017-01-11 DIAGNOSIS — M5416 Radiculopathy, lumbar region: Secondary | ICD-10-CM | POA: Diagnosis not present

## 2017-01-11 DIAGNOSIS — M4726 Other spondylosis with radiculopathy, lumbar region: Secondary | ICD-10-CM | POA: Diagnosis not present

## 2017-03-09 DIAGNOSIS — D539 Nutritional anemia, unspecified: Secondary | ICD-10-CM | POA: Diagnosis not present

## 2017-03-09 DIAGNOSIS — I10 Essential (primary) hypertension: Secondary | ICD-10-CM | POA: Diagnosis not present

## 2017-03-09 DIAGNOSIS — E785 Hyperlipidemia, unspecified: Secondary | ICD-10-CM | POA: Diagnosis not present

## 2017-03-16 DIAGNOSIS — N189 Chronic kidney disease, unspecified: Secondary | ICD-10-CM | POA: Diagnosis not present

## 2017-03-16 DIAGNOSIS — D539 Nutritional anemia, unspecified: Secondary | ICD-10-CM | POA: Diagnosis not present

## 2017-03-16 DIAGNOSIS — G2581 Restless legs syndrome: Secondary | ICD-10-CM | POA: Diagnosis not present

## 2017-03-16 DIAGNOSIS — E785 Hyperlipidemia, unspecified: Secondary | ICD-10-CM | POA: Diagnosis not present

## 2017-03-16 DIAGNOSIS — Z9181 History of falling: Secondary | ICD-10-CM | POA: Diagnosis not present

## 2017-03-16 DIAGNOSIS — I10 Essential (primary) hypertension: Secondary | ICD-10-CM | POA: Diagnosis not present

## 2017-03-16 DIAGNOSIS — Z23 Encounter for immunization: Secondary | ICD-10-CM | POA: Diagnosis not present

## 2017-03-16 DIAGNOSIS — Z1389 Encounter for screening for other disorder: Secondary | ICD-10-CM | POA: Diagnosis not present

## 2017-03-21 DIAGNOSIS — I2510 Atherosclerotic heart disease of native coronary artery without angina pectoris: Secondary | ICD-10-CM | POA: Diagnosis not present

## 2017-03-21 DIAGNOSIS — I10 Essential (primary) hypertension: Secondary | ICD-10-CM | POA: Diagnosis not present

## 2017-03-21 DIAGNOSIS — E782 Mixed hyperlipidemia: Secondary | ICD-10-CM | POA: Diagnosis not present

## 2017-03-21 DIAGNOSIS — I350 Nonrheumatic aortic (valve) stenosis: Secondary | ICD-10-CM | POA: Diagnosis not present

## 2017-03-23 DIAGNOSIS — I2510 Atherosclerotic heart disease of native coronary artery without angina pectoris: Secondary | ICD-10-CM | POA: Diagnosis not present

## 2017-03-23 DIAGNOSIS — I350 Nonrheumatic aortic (valve) stenosis: Secondary | ICD-10-CM | POA: Diagnosis not present

## 2017-03-23 DIAGNOSIS — E782 Mixed hyperlipidemia: Secondary | ICD-10-CM | POA: Diagnosis not present

## 2017-03-23 DIAGNOSIS — I10 Essential (primary) hypertension: Secondary | ICD-10-CM | POA: Diagnosis not present

## 2017-04-19 DIAGNOSIS — D539 Nutritional anemia, unspecified: Secondary | ICD-10-CM | POA: Diagnosis not present

## 2017-04-25 DIAGNOSIS — S80812A Abrasion, left lower leg, initial encounter: Secondary | ICD-10-CM | POA: Diagnosis not present

## 2017-04-25 DIAGNOSIS — D509 Iron deficiency anemia, unspecified: Secondary | ICD-10-CM | POA: Diagnosis not present

## 2017-04-27 DIAGNOSIS — M48062 Spinal stenosis, lumbar region with neurogenic claudication: Secondary | ICD-10-CM | POA: Diagnosis not present

## 2017-04-27 DIAGNOSIS — M4316 Spondylolisthesis, lumbar region: Secondary | ICD-10-CM | POA: Diagnosis not present

## 2017-04-27 DIAGNOSIS — M5136 Other intervertebral disc degeneration, lumbar region: Secondary | ICD-10-CM | POA: Diagnosis not present

## 2017-04-27 DIAGNOSIS — M47816 Spondylosis without myelopathy or radiculopathy, lumbar region: Secondary | ICD-10-CM | POA: Diagnosis not present

## 2017-05-07 DIAGNOSIS — J209 Acute bronchitis, unspecified: Secondary | ICD-10-CM | POA: Diagnosis not present

## 2017-05-07 DIAGNOSIS — J019 Acute sinusitis, unspecified: Secondary | ICD-10-CM | POA: Diagnosis not present

## 2017-05-10 DIAGNOSIS — M48061 Spinal stenosis, lumbar region without neurogenic claudication: Secondary | ICD-10-CM | POA: Diagnosis not present

## 2017-05-10 DIAGNOSIS — M5136 Other intervertebral disc degeneration, lumbar region: Secondary | ICD-10-CM | POA: Diagnosis not present

## 2017-05-10 DIAGNOSIS — M4726 Other spondylosis with radiculopathy, lumbar region: Secondary | ICD-10-CM | POA: Diagnosis not present

## 2017-06-01 DIAGNOSIS — Z6833 Body mass index (BMI) 33.0-33.9, adult: Secondary | ICD-10-CM | POA: Diagnosis not present

## 2017-06-01 DIAGNOSIS — J209 Acute bronchitis, unspecified: Secondary | ICD-10-CM | POA: Diagnosis not present

## 2017-07-05 DIAGNOSIS — D509 Iron deficiency anemia, unspecified: Secondary | ICD-10-CM | POA: Diagnosis not present

## 2017-07-05 DIAGNOSIS — I10 Essential (primary) hypertension: Secondary | ICD-10-CM | POA: Diagnosis not present

## 2017-07-05 DIAGNOSIS — B03 Smallpox: Secondary | ICD-10-CM | POA: Diagnosis not present

## 2017-07-05 DIAGNOSIS — E785 Hyperlipidemia, unspecified: Secondary | ICD-10-CM | POA: Diagnosis not present

## 2017-07-09 DIAGNOSIS — J019 Acute sinusitis, unspecified: Secondary | ICD-10-CM | POA: Diagnosis not present

## 2017-07-09 DIAGNOSIS — J209 Acute bronchitis, unspecified: Secondary | ICD-10-CM | POA: Diagnosis not present

## 2017-07-12 DIAGNOSIS — J449 Chronic obstructive pulmonary disease, unspecified: Secondary | ICD-10-CM | POA: Diagnosis not present

## 2017-07-12 DIAGNOSIS — G2581 Restless legs syndrome: Secondary | ICD-10-CM | POA: Diagnosis not present

## 2017-07-12 DIAGNOSIS — R7303 Prediabetes: Secondary | ICD-10-CM | POA: Diagnosis not present

## 2017-07-12 DIAGNOSIS — I10 Essential (primary) hypertension: Secondary | ICD-10-CM | POA: Diagnosis not present

## 2017-07-12 DIAGNOSIS — Z139 Encounter for screening, unspecified: Secondary | ICD-10-CM | POA: Diagnosis not present

## 2017-07-12 DIAGNOSIS — E785 Hyperlipidemia, unspecified: Secondary | ICD-10-CM | POA: Diagnosis not present

## 2017-07-12 DIAGNOSIS — M5416 Radiculopathy, lumbar region: Secondary | ICD-10-CM | POA: Diagnosis not present

## 2017-07-12 DIAGNOSIS — D539 Nutritional anemia, unspecified: Secondary | ICD-10-CM | POA: Diagnosis not present

## 2017-07-23 DIAGNOSIS — Z961 Presence of intraocular lens: Secondary | ICD-10-CM | POA: Diagnosis not present

## 2017-07-23 DIAGNOSIS — H47012 Ischemic optic neuropathy, left eye: Secondary | ICD-10-CM | POA: Diagnosis not present

## 2017-07-24 DIAGNOSIS — M5416 Radiculopathy, lumbar region: Secondary | ICD-10-CM | POA: Diagnosis not present

## 2017-07-25 DIAGNOSIS — M5416 Radiculopathy, lumbar region: Secondary | ICD-10-CM | POA: Diagnosis not present

## 2017-07-30 DIAGNOSIS — M5416 Radiculopathy, lumbar region: Secondary | ICD-10-CM | POA: Diagnosis not present

## 2017-08-02 DIAGNOSIS — M5416 Radiculopathy, lumbar region: Secondary | ICD-10-CM | POA: Diagnosis not present

## 2017-08-03 DIAGNOSIS — L821 Other seborrheic keratosis: Secondary | ICD-10-CM | POA: Diagnosis not present

## 2017-08-03 DIAGNOSIS — C44629 Squamous cell carcinoma of skin of left upper limb, including shoulder: Secondary | ICD-10-CM | POA: Diagnosis not present

## 2017-08-03 DIAGNOSIS — L853 Xerosis cutis: Secondary | ICD-10-CM | POA: Diagnosis not present

## 2017-08-03 DIAGNOSIS — L570 Actinic keratosis: Secondary | ICD-10-CM | POA: Diagnosis not present

## 2017-08-03 DIAGNOSIS — C44622 Squamous cell carcinoma of skin of right upper limb, including shoulder: Secondary | ICD-10-CM | POA: Diagnosis not present

## 2017-08-03 DIAGNOSIS — L578 Other skin changes due to chronic exposure to nonionizing radiation: Secondary | ICD-10-CM | POA: Diagnosis not present

## 2017-08-06 DIAGNOSIS — M5416 Radiculopathy, lumbar region: Secondary | ICD-10-CM | POA: Diagnosis not present

## 2017-08-09 DIAGNOSIS — M5416 Radiculopathy, lumbar region: Secondary | ICD-10-CM | POA: Diagnosis not present

## 2017-08-13 DIAGNOSIS — M5416 Radiculopathy, lumbar region: Secondary | ICD-10-CM | POA: Diagnosis not present

## 2017-08-16 DIAGNOSIS — M5416 Radiculopathy, lumbar region: Secondary | ICD-10-CM | POA: Diagnosis not present

## 2017-08-20 DIAGNOSIS — M5416 Radiculopathy, lumbar region: Secondary | ICD-10-CM | POA: Diagnosis not present

## 2017-08-23 DIAGNOSIS — M5416 Radiculopathy, lumbar region: Secondary | ICD-10-CM | POA: Diagnosis not present

## 2017-08-24 DIAGNOSIS — M4316 Spondylolisthesis, lumbar region: Secondary | ICD-10-CM | POA: Diagnosis not present

## 2017-08-24 DIAGNOSIS — M48062 Spinal stenosis, lumbar region with neurogenic claudication: Secondary | ICD-10-CM | POA: Diagnosis not present

## 2017-08-24 DIAGNOSIS — M5136 Other intervertebral disc degeneration, lumbar region: Secondary | ICD-10-CM | POA: Diagnosis not present

## 2017-08-24 DIAGNOSIS — M47816 Spondylosis without myelopathy or radiculopathy, lumbar region: Secondary | ICD-10-CM | POA: Diagnosis not present

## 2017-08-27 DIAGNOSIS — M5416 Radiculopathy, lumbar region: Secondary | ICD-10-CM | POA: Diagnosis not present

## 2017-08-30 DIAGNOSIS — M4726 Other spondylosis with radiculopathy, lumbar region: Secondary | ICD-10-CM | POA: Diagnosis not present

## 2017-08-30 DIAGNOSIS — M48061 Spinal stenosis, lumbar region without neurogenic claudication: Secondary | ICD-10-CM | POA: Diagnosis not present

## 2017-08-30 DIAGNOSIS — M5136 Other intervertebral disc degeneration, lumbar region: Secondary | ICD-10-CM | POA: Diagnosis not present

## 2017-09-13 DIAGNOSIS — Z1211 Encounter for screening for malignant neoplasm of colon: Secondary | ICD-10-CM | POA: Diagnosis not present

## 2017-09-13 DIAGNOSIS — D122 Benign neoplasm of ascending colon: Secondary | ICD-10-CM | POA: Diagnosis not present

## 2017-09-13 DIAGNOSIS — Z86010 Personal history of colonic polyps: Secondary | ICD-10-CM | POA: Diagnosis not present

## 2017-09-13 DIAGNOSIS — K5730 Diverticulosis of large intestine without perforation or abscess without bleeding: Secondary | ICD-10-CM | POA: Diagnosis not present

## 2017-09-13 DIAGNOSIS — D126 Benign neoplasm of colon, unspecified: Secondary | ICD-10-CM | POA: Diagnosis not present

## 2017-09-13 DIAGNOSIS — K635 Polyp of colon: Secondary | ICD-10-CM | POA: Diagnosis not present

## 2017-09-13 DIAGNOSIS — K5520 Angiodysplasia of colon without hemorrhage: Secondary | ICD-10-CM | POA: Diagnosis not present

## 2017-09-13 DIAGNOSIS — K621 Rectal polyp: Secondary | ICD-10-CM | POA: Diagnosis not present

## 2017-09-13 DIAGNOSIS — K648 Other hemorrhoids: Secondary | ICD-10-CM | POA: Diagnosis not present

## 2017-09-20 DIAGNOSIS — I2510 Atherosclerotic heart disease of native coronary artery without angina pectoris: Secondary | ICD-10-CM | POA: Diagnosis not present

## 2017-09-20 DIAGNOSIS — E782 Mixed hyperlipidemia: Secondary | ICD-10-CM | POA: Diagnosis not present

## 2017-09-20 DIAGNOSIS — I350 Nonrheumatic aortic (valve) stenosis: Secondary | ICD-10-CM | POA: Diagnosis not present

## 2017-09-20 DIAGNOSIS — I10 Essential (primary) hypertension: Secondary | ICD-10-CM | POA: Diagnosis not present

## 2017-10-11 DIAGNOSIS — E785 Hyperlipidemia, unspecified: Secondary | ICD-10-CM | POA: Diagnosis not present

## 2017-10-11 DIAGNOSIS — Z125 Encounter for screening for malignant neoplasm of prostate: Secondary | ICD-10-CM | POA: Diagnosis not present

## 2017-10-11 DIAGNOSIS — D539 Nutritional anemia, unspecified: Secondary | ICD-10-CM | POA: Diagnosis not present

## 2017-10-11 DIAGNOSIS — I10 Essential (primary) hypertension: Secondary | ICD-10-CM | POA: Diagnosis not present

## 2017-10-19 DIAGNOSIS — F17200 Nicotine dependence, unspecified, uncomplicated: Secondary | ICD-10-CM | POA: Diagnosis not present

## 2017-10-19 DIAGNOSIS — D509 Iron deficiency anemia, unspecified: Secondary | ICD-10-CM | POA: Diagnosis not present

## 2017-10-19 DIAGNOSIS — J449 Chronic obstructive pulmonary disease, unspecified: Secondary | ICD-10-CM | POA: Diagnosis not present

## 2017-10-19 DIAGNOSIS — N189 Chronic kidney disease, unspecified: Secondary | ICD-10-CM | POA: Diagnosis not present

## 2017-10-19 DIAGNOSIS — I10 Essential (primary) hypertension: Secondary | ICD-10-CM | POA: Diagnosis not present

## 2017-10-19 DIAGNOSIS — M5416 Radiculopathy, lumbar region: Secondary | ICD-10-CM | POA: Diagnosis not present

## 2017-10-19 DIAGNOSIS — E785 Hyperlipidemia, unspecified: Secondary | ICD-10-CM | POA: Diagnosis not present

## 2017-10-19 DIAGNOSIS — E875 Hyperkalemia: Secondary | ICD-10-CM | POA: Diagnosis not present

## 2017-10-19 DIAGNOSIS — Z6833 Body mass index (BMI) 33.0-33.9, adult: Secondary | ICD-10-CM | POA: Diagnosis not present

## 2017-11-06 DIAGNOSIS — I83819 Varicose veins of unspecified lower extremities with pain: Secondary | ICD-10-CM | POA: Diagnosis not present

## 2017-11-06 DIAGNOSIS — M79604 Pain in right leg: Secondary | ICD-10-CM | POA: Diagnosis not present

## 2017-11-06 DIAGNOSIS — R600 Localized edema: Secondary | ICD-10-CM | POA: Diagnosis not present

## 2017-11-19 DIAGNOSIS — R600 Localized edema: Secondary | ICD-10-CM | POA: Diagnosis not present

## 2017-11-19 DIAGNOSIS — M79604 Pain in right leg: Secondary | ICD-10-CM | POA: Diagnosis not present

## 2017-11-19 DIAGNOSIS — I83819 Varicose veins of unspecified lower extremities with pain: Secondary | ICD-10-CM | POA: Diagnosis not present

## 2017-12-12 DIAGNOSIS — S93602A Unspecified sprain of left foot, initial encounter: Secondary | ICD-10-CM | POA: Diagnosis not present

## 2017-12-12 DIAGNOSIS — S93402A Sprain of unspecified ligament of left ankle, initial encounter: Secondary | ICD-10-CM | POA: Diagnosis not present

## 2017-12-19 DIAGNOSIS — M79661 Pain in right lower leg: Secondary | ICD-10-CM | POA: Diagnosis not present

## 2017-12-19 DIAGNOSIS — M79604 Pain in right leg: Secondary | ICD-10-CM | POA: Diagnosis not present

## 2017-12-19 DIAGNOSIS — I739 Peripheral vascular disease, unspecified: Secondary | ICD-10-CM | POA: Diagnosis not present

## 2017-12-19 DIAGNOSIS — M79605 Pain in left leg: Secondary | ICD-10-CM | POA: Diagnosis not present

## 2017-12-19 DIAGNOSIS — M79662 Pain in left lower leg: Secondary | ICD-10-CM | POA: Diagnosis not present

## 2017-12-25 DIAGNOSIS — M48062 Spinal stenosis, lumbar region with neurogenic claudication: Secondary | ICD-10-CM | POA: Diagnosis not present

## 2017-12-25 DIAGNOSIS — M4316 Spondylolisthesis, lumbar region: Secondary | ICD-10-CM | POA: Diagnosis not present

## 2017-12-25 DIAGNOSIS — M5136 Other intervertebral disc degeneration, lumbar region: Secondary | ICD-10-CM | POA: Diagnosis not present

## 2017-12-25 DIAGNOSIS — M47816 Spondylosis without myelopathy or radiculopathy, lumbar region: Secondary | ICD-10-CM | POA: Diagnosis not present

## 2017-12-27 DIAGNOSIS — M5136 Other intervertebral disc degeneration, lumbar region: Secondary | ICD-10-CM | POA: Diagnosis not present

## 2017-12-27 DIAGNOSIS — M4726 Other spondylosis with radiculopathy, lumbar region: Secondary | ICD-10-CM | POA: Diagnosis not present

## 2017-12-27 DIAGNOSIS — M48061 Spinal stenosis, lumbar region without neurogenic claudication: Secondary | ICD-10-CM | POA: Diagnosis not present

## 2018-01-08 DIAGNOSIS — S61412A Laceration without foreign body of left hand, initial encounter: Secondary | ICD-10-CM | POA: Diagnosis not present

## 2018-01-24 DIAGNOSIS — I10 Essential (primary) hypertension: Secondary | ICD-10-CM | POA: Diagnosis not present

## 2018-01-24 DIAGNOSIS — R7303 Prediabetes: Secondary | ICD-10-CM | POA: Diagnosis not present

## 2018-01-24 DIAGNOSIS — D509 Iron deficiency anemia, unspecified: Secondary | ICD-10-CM | POA: Diagnosis not present

## 2018-01-24 DIAGNOSIS — E785 Hyperlipidemia, unspecified: Secondary | ICD-10-CM | POA: Diagnosis not present

## 2018-01-31 DIAGNOSIS — N189 Chronic kidney disease, unspecified: Secondary | ICD-10-CM | POA: Diagnosis not present

## 2018-01-31 DIAGNOSIS — J449 Chronic obstructive pulmonary disease, unspecified: Secondary | ICD-10-CM | POA: Diagnosis not present

## 2018-01-31 DIAGNOSIS — M79604 Pain in right leg: Secondary | ICD-10-CM | POA: Diagnosis not present

## 2018-01-31 DIAGNOSIS — D539 Nutritional anemia, unspecified: Secondary | ICD-10-CM | POA: Diagnosis not present

## 2018-01-31 DIAGNOSIS — I10 Essential (primary) hypertension: Secondary | ICD-10-CM | POA: Diagnosis not present

## 2018-01-31 DIAGNOSIS — F17200 Nicotine dependence, unspecified, uncomplicated: Secondary | ICD-10-CM | POA: Diagnosis not present

## 2018-01-31 DIAGNOSIS — E785 Hyperlipidemia, unspecified: Secondary | ICD-10-CM | POA: Diagnosis not present

## 2018-02-01 DIAGNOSIS — L570 Actinic keratosis: Secondary | ICD-10-CM | POA: Diagnosis not present

## 2018-02-01 DIAGNOSIS — L820 Inflamed seborrheic keratosis: Secondary | ICD-10-CM | POA: Diagnosis not present

## 2018-02-01 DIAGNOSIS — L578 Other skin changes due to chronic exposure to nonionizing radiation: Secondary | ICD-10-CM | POA: Diagnosis not present

## 2018-02-01 DIAGNOSIS — L821 Other seborrheic keratosis: Secondary | ICD-10-CM | POA: Diagnosis not present

## 2018-02-20 DIAGNOSIS — Z1339 Encounter for screening examination for other mental health and behavioral disorders: Secondary | ICD-10-CM | POA: Diagnosis not present

## 2018-02-20 DIAGNOSIS — Z125 Encounter for screening for malignant neoplasm of prostate: Secondary | ICD-10-CM | POA: Diagnosis not present

## 2018-02-20 DIAGNOSIS — Z6835 Body mass index (BMI) 35.0-35.9, adult: Secondary | ICD-10-CM | POA: Diagnosis not present

## 2018-02-20 DIAGNOSIS — E785 Hyperlipidemia, unspecified: Secondary | ICD-10-CM | POA: Diagnosis not present

## 2018-02-20 DIAGNOSIS — Z9181 History of falling: Secondary | ICD-10-CM | POA: Diagnosis not present

## 2018-02-20 DIAGNOSIS — Z0000 Encounter for general adult medical examination without abnormal findings: Secondary | ICD-10-CM | POA: Diagnosis not present

## 2018-02-20 DIAGNOSIS — Z136 Encounter for screening for cardiovascular disorders: Secondary | ICD-10-CM | POA: Diagnosis not present

## 2018-02-20 DIAGNOSIS — Z1211 Encounter for screening for malignant neoplasm of colon: Secondary | ICD-10-CM | POA: Diagnosis not present

## 2018-02-20 DIAGNOSIS — Z1331 Encounter for screening for depression: Secondary | ICD-10-CM | POA: Diagnosis not present

## 2018-02-20 DIAGNOSIS — E669 Obesity, unspecified: Secondary | ICD-10-CM | POA: Diagnosis not present

## 2018-03-28 DIAGNOSIS — Z23 Encounter for immunization: Secondary | ICD-10-CM | POA: Diagnosis not present

## 2018-04-01 DIAGNOSIS — G5603 Carpal tunnel syndrome, bilateral upper limbs: Secondary | ICD-10-CM | POA: Diagnosis not present

## 2018-04-01 DIAGNOSIS — M79641 Pain in right hand: Secondary | ICD-10-CM | POA: Diagnosis not present

## 2018-04-01 DIAGNOSIS — Z6835 Body mass index (BMI) 35.0-35.9, adult: Secondary | ICD-10-CM | POA: Diagnosis not present

## 2018-04-02 DIAGNOSIS — I350 Nonrheumatic aortic (valve) stenosis: Secondary | ICD-10-CM | POA: Diagnosis not present

## 2018-04-02 DIAGNOSIS — Z720 Tobacco use: Secondary | ICD-10-CM | POA: Diagnosis not present

## 2018-04-02 DIAGNOSIS — I10 Essential (primary) hypertension: Secondary | ICD-10-CM | POA: Diagnosis not present

## 2018-04-02 DIAGNOSIS — Z7982 Long term (current) use of aspirin: Secondary | ICD-10-CM | POA: Diagnosis not present

## 2018-04-02 DIAGNOSIS — E782 Mixed hyperlipidemia: Secondary | ICD-10-CM | POA: Diagnosis not present

## 2018-04-02 DIAGNOSIS — I2510 Atherosclerotic heart disease of native coronary artery without angina pectoris: Secondary | ICD-10-CM | POA: Diagnosis not present

## 2018-04-22 DIAGNOSIS — M79642 Pain in left hand: Secondary | ICD-10-CM | POA: Diagnosis not present

## 2018-04-22 DIAGNOSIS — M79641 Pain in right hand: Secondary | ICD-10-CM | POA: Diagnosis not present

## 2018-04-22 DIAGNOSIS — T7521XA Pneumatic hammer syndrome, initial encounter: Secondary | ICD-10-CM | POA: Diagnosis not present

## 2018-04-24 DIAGNOSIS — I10 Essential (primary) hypertension: Secondary | ICD-10-CM | POA: Diagnosis not present

## 2018-04-24 DIAGNOSIS — K5520 Angiodysplasia of colon without hemorrhage: Secondary | ICD-10-CM | POA: Diagnosis not present

## 2018-04-24 DIAGNOSIS — D124 Benign neoplasm of descending colon: Secondary | ICD-10-CM | POA: Diagnosis not present

## 2018-04-24 DIAGNOSIS — K635 Polyp of colon: Secondary | ICD-10-CM | POA: Diagnosis not present

## 2018-04-24 DIAGNOSIS — Z1211 Encounter for screening for malignant neoplasm of colon: Secondary | ICD-10-CM | POA: Diagnosis not present

## 2018-04-24 DIAGNOSIS — K648 Other hemorrhoids: Secondary | ICD-10-CM | POA: Diagnosis not present

## 2018-04-24 DIAGNOSIS — K219 Gastro-esophageal reflux disease without esophagitis: Secondary | ICD-10-CM | POA: Diagnosis not present

## 2018-04-24 DIAGNOSIS — E785 Hyperlipidemia, unspecified: Secondary | ICD-10-CM | POA: Diagnosis not present

## 2018-04-24 DIAGNOSIS — F17210 Nicotine dependence, cigarettes, uncomplicated: Secondary | ICD-10-CM | POA: Diagnosis not present

## 2018-04-24 DIAGNOSIS — K5730 Diverticulosis of large intestine without perforation or abscess without bleeding: Secondary | ICD-10-CM | POA: Diagnosis not present

## 2018-04-24 DIAGNOSIS — I499 Cardiac arrhythmia, unspecified: Secondary | ICD-10-CM | POA: Diagnosis not present

## 2018-04-24 DIAGNOSIS — I2510 Atherosclerotic heart disease of native coronary artery without angina pectoris: Secondary | ICD-10-CM | POA: Diagnosis not present

## 2018-04-24 DIAGNOSIS — Z86010 Personal history of colonic polyps: Secondary | ICD-10-CM | POA: Diagnosis not present

## 2018-04-26 DIAGNOSIS — M4316 Spondylolisthesis, lumbar region: Secondary | ICD-10-CM | POA: Diagnosis not present

## 2018-04-26 DIAGNOSIS — M48062 Spinal stenosis, lumbar region with neurogenic claudication: Secondary | ICD-10-CM | POA: Diagnosis not present

## 2018-04-26 DIAGNOSIS — D509 Iron deficiency anemia, unspecified: Secondary | ICD-10-CM | POA: Diagnosis not present

## 2018-04-26 DIAGNOSIS — M47816 Spondylosis without myelopathy or radiculopathy, lumbar region: Secondary | ICD-10-CM | POA: Diagnosis not present

## 2018-04-26 DIAGNOSIS — M5136 Other intervertebral disc degeneration, lumbar region: Secondary | ICD-10-CM | POA: Diagnosis not present

## 2018-04-29 DIAGNOSIS — D539 Nutritional anemia, unspecified: Secondary | ICD-10-CM | POA: Diagnosis not present

## 2018-04-29 DIAGNOSIS — I10 Essential (primary) hypertension: Secondary | ICD-10-CM | POA: Diagnosis not present

## 2018-04-29 DIAGNOSIS — E785 Hyperlipidemia, unspecified: Secondary | ICD-10-CM | POA: Diagnosis not present

## 2018-04-30 DIAGNOSIS — I10 Essential (primary) hypertension: Secondary | ICD-10-CM | POA: Diagnosis not present

## 2018-04-30 DIAGNOSIS — E785 Hyperlipidemia, unspecified: Secondary | ICD-10-CM | POA: Diagnosis not present

## 2018-04-30 DIAGNOSIS — J449 Chronic obstructive pulmonary disease, unspecified: Secondary | ICD-10-CM | POA: Diagnosis not present

## 2018-04-30 DIAGNOSIS — Z6835 Body mass index (BMI) 35.0-35.9, adult: Secondary | ICD-10-CM | POA: Diagnosis not present

## 2018-04-30 DIAGNOSIS — N189 Chronic kidney disease, unspecified: Secondary | ICD-10-CM | POA: Diagnosis not present

## 2018-04-30 DIAGNOSIS — D539 Nutritional anemia, unspecified: Secondary | ICD-10-CM | POA: Diagnosis not present

## 2018-04-30 DIAGNOSIS — M79604 Pain in right leg: Secondary | ICD-10-CM | POA: Diagnosis not present

## 2018-05-02 DIAGNOSIS — M48061 Spinal stenosis, lumbar region without neurogenic claudication: Secondary | ICD-10-CM | POA: Diagnosis not present

## 2018-05-02 DIAGNOSIS — M5136 Other intervertebral disc degeneration, lumbar region: Secondary | ICD-10-CM | POA: Diagnosis not present

## 2018-05-02 DIAGNOSIS — M4726 Other spondylosis with radiculopathy, lumbar region: Secondary | ICD-10-CM | POA: Diagnosis not present

## 2018-05-07 DIAGNOSIS — M79641 Pain in right hand: Secondary | ICD-10-CM | POA: Diagnosis not present

## 2018-05-07 DIAGNOSIS — G5603 Carpal tunnel syndrome, bilateral upper limbs: Secondary | ICD-10-CM | POA: Diagnosis not present

## 2018-05-07 DIAGNOSIS — M79642 Pain in left hand: Secondary | ICD-10-CM | POA: Diagnosis not present

## 2018-05-20 DIAGNOSIS — G5603 Carpal tunnel syndrome, bilateral upper limbs: Secondary | ICD-10-CM | POA: Diagnosis not present

## 2018-05-28 DIAGNOSIS — M79641 Pain in right hand: Secondary | ICD-10-CM | POA: Diagnosis not present

## 2018-05-28 DIAGNOSIS — G5603 Carpal tunnel syndrome, bilateral upper limbs: Secondary | ICD-10-CM | POA: Diagnosis not present

## 2018-05-28 DIAGNOSIS — M79642 Pain in left hand: Secondary | ICD-10-CM | POA: Diagnosis not present

## 2018-05-31 DIAGNOSIS — D509 Iron deficiency anemia, unspecified: Secondary | ICD-10-CM | POA: Diagnosis not present

## 2018-05-31 DIAGNOSIS — K319 Disease of stomach and duodenum, unspecified: Secondary | ICD-10-CM | POA: Diagnosis not present

## 2018-05-31 DIAGNOSIS — K2930 Chronic superficial gastritis without bleeding: Secondary | ICD-10-CM | POA: Diagnosis not present

## 2018-05-31 DIAGNOSIS — K2980 Duodenitis without bleeding: Secondary | ICD-10-CM | POA: Diagnosis not present

## 2018-05-31 DIAGNOSIS — K2970 Gastritis, unspecified, without bleeding: Secondary | ICD-10-CM | POA: Diagnosis not present

## 2018-06-27 DIAGNOSIS — G5601 Carpal tunnel syndrome, right upper limb: Secondary | ICD-10-CM | POA: Diagnosis not present

## 2018-06-27 DIAGNOSIS — G5602 Carpal tunnel syndrome, left upper limb: Secondary | ICD-10-CM | POA: Diagnosis not present

## 2018-07-24 DIAGNOSIS — G5602 Carpal tunnel syndrome, left upper limb: Secondary | ICD-10-CM | POA: Diagnosis not present

## 2018-07-26 DIAGNOSIS — E785 Hyperlipidemia, unspecified: Secondary | ICD-10-CM | POA: Diagnosis not present

## 2018-07-26 DIAGNOSIS — D539 Nutritional anemia, unspecified: Secondary | ICD-10-CM | POA: Diagnosis not present

## 2018-07-26 DIAGNOSIS — I10 Essential (primary) hypertension: Secondary | ICD-10-CM | POA: Diagnosis not present

## 2018-07-26 DIAGNOSIS — R7303 Prediabetes: Secondary | ICD-10-CM | POA: Diagnosis not present

## 2018-08-02 DIAGNOSIS — J449 Chronic obstructive pulmonary disease, unspecified: Secondary | ICD-10-CM | POA: Diagnosis not present

## 2018-08-02 DIAGNOSIS — E785 Hyperlipidemia, unspecified: Secondary | ICD-10-CM | POA: Diagnosis not present

## 2018-08-02 DIAGNOSIS — Z6835 Body mass index (BMI) 35.0-35.9, adult: Secondary | ICD-10-CM | POA: Diagnosis not present

## 2018-08-02 DIAGNOSIS — N189 Chronic kidney disease, unspecified: Secondary | ICD-10-CM | POA: Diagnosis not present

## 2018-08-02 DIAGNOSIS — D539 Nutritional anemia, unspecified: Secondary | ICD-10-CM | POA: Diagnosis not present

## 2018-08-02 DIAGNOSIS — I10 Essential (primary) hypertension: Secondary | ICD-10-CM | POA: Diagnosis not present

## 2018-08-08 DIAGNOSIS — G5602 Carpal tunnel syndrome, left upper limb: Secondary | ICD-10-CM | POA: Diagnosis not present

## 2018-08-08 DIAGNOSIS — Z5189 Encounter for other specified aftercare: Secondary | ICD-10-CM | POA: Diagnosis not present

## 2018-08-08 DIAGNOSIS — G5601 Carpal tunnel syndrome, right upper limb: Secondary | ICD-10-CM | POA: Diagnosis not present

## 2018-08-26 DIAGNOSIS — M48062 Spinal stenosis, lumbar region with neurogenic claudication: Secondary | ICD-10-CM | POA: Diagnosis not present

## 2018-08-26 DIAGNOSIS — M47816 Spondylosis without myelopathy or radiculopathy, lumbar region: Secondary | ICD-10-CM | POA: Diagnosis not present

## 2018-08-26 DIAGNOSIS — M5136 Other intervertebral disc degeneration, lumbar region: Secondary | ICD-10-CM | POA: Diagnosis not present

## 2018-08-26 DIAGNOSIS — M4316 Spondylolisthesis, lumbar region: Secondary | ICD-10-CM | POA: Diagnosis not present

## 2018-09-11 DIAGNOSIS — Z5189 Encounter for other specified aftercare: Secondary | ICD-10-CM | POA: Diagnosis not present

## 2018-09-11 DIAGNOSIS — S52122D Displaced fracture of head of left radius, subsequent encounter for closed fracture with routine healing: Secondary | ICD-10-CM | POA: Diagnosis not present

## 2018-09-11 DIAGNOSIS — M65321 Trigger finger, right index finger: Secondary | ICD-10-CM | POA: Diagnosis not present

## 2018-09-11 DIAGNOSIS — Z9181 History of falling: Secondary | ICD-10-CM | POA: Diagnosis not present

## 2018-09-11 DIAGNOSIS — R2689 Other abnormalities of gait and mobility: Secondary | ICD-10-CM | POA: Diagnosis not present

## 2018-09-11 DIAGNOSIS — M65331 Trigger finger, right middle finger: Secondary | ICD-10-CM | POA: Diagnosis not present

## 2018-09-11 DIAGNOSIS — D539 Nutritional anemia, unspecified: Secondary | ICD-10-CM | POA: Diagnosis not present

## 2018-09-11 DIAGNOSIS — S52501D Unspecified fracture of the lower end of right radius, subsequent encounter for closed fracture with routine healing: Secondary | ICD-10-CM | POA: Diagnosis not present

## 2018-09-11 DIAGNOSIS — R490 Dysphonia: Secondary | ICD-10-CM | POA: Diagnosis not present

## 2018-09-11 DIAGNOSIS — C73 Malignant neoplasm of thyroid gland: Secondary | ICD-10-CM | POA: Diagnosis not present

## 2018-09-11 DIAGNOSIS — M6281 Muscle weakness (generalized): Secondary | ICD-10-CM | POA: Diagnosis not present

## 2018-10-01 DIAGNOSIS — I2510 Atherosclerotic heart disease of native coronary artery without angina pectoris: Secondary | ICD-10-CM | POA: Diagnosis not present

## 2018-10-01 DIAGNOSIS — E782 Mixed hyperlipidemia: Secondary | ICD-10-CM | POA: Diagnosis not present

## 2018-10-01 DIAGNOSIS — F17210 Nicotine dependence, cigarettes, uncomplicated: Secondary | ICD-10-CM | POA: Diagnosis not present

## 2018-10-01 DIAGNOSIS — I10 Essential (primary) hypertension: Secondary | ICD-10-CM | POA: Diagnosis not present

## 2018-10-01 DIAGNOSIS — Z7982 Long term (current) use of aspirin: Secondary | ICD-10-CM | POA: Diagnosis not present

## 2018-10-01 DIAGNOSIS — I350 Nonrheumatic aortic (valve) stenosis: Secondary | ICD-10-CM | POA: Diagnosis not present

## 2018-10-03 DIAGNOSIS — Z5189 Encounter for other specified aftercare: Secondary | ICD-10-CM | POA: Diagnosis not present

## 2018-10-03 DIAGNOSIS — G5601 Carpal tunnel syndrome, right upper limb: Secondary | ICD-10-CM | POA: Diagnosis not present

## 2018-10-03 DIAGNOSIS — G5602 Carpal tunnel syndrome, left upper limb: Secondary | ICD-10-CM | POA: Diagnosis not present

## 2018-10-11 DIAGNOSIS — M5136 Other intervertebral disc degeneration, lumbar region: Secondary | ICD-10-CM | POA: Diagnosis not present

## 2018-10-11 DIAGNOSIS — M4726 Other spondylosis with radiculopathy, lumbar region: Secondary | ICD-10-CM | POA: Diagnosis not present

## 2018-10-11 DIAGNOSIS — M48061 Spinal stenosis, lumbar region without neurogenic claudication: Secondary | ICD-10-CM | POA: Diagnosis not present

## 2018-10-30 DIAGNOSIS — G5601 Carpal tunnel syndrome, right upper limb: Secondary | ICD-10-CM | POA: Diagnosis not present

## 2018-11-14 DIAGNOSIS — Z4789 Encounter for other orthopedic aftercare: Secondary | ICD-10-CM | POA: Diagnosis not present

## 2018-11-14 DIAGNOSIS — G5601 Carpal tunnel syndrome, right upper limb: Secondary | ICD-10-CM | POA: Diagnosis not present

## 2018-11-28 DIAGNOSIS — I10 Essential (primary) hypertension: Secondary | ICD-10-CM | POA: Diagnosis not present

## 2018-11-28 DIAGNOSIS — Z125 Encounter for screening for malignant neoplasm of prostate: Secondary | ICD-10-CM | POA: Diagnosis not present

## 2018-11-28 DIAGNOSIS — E785 Hyperlipidemia, unspecified: Secondary | ICD-10-CM | POA: Diagnosis not present

## 2018-11-28 DIAGNOSIS — D539 Nutritional anemia, unspecified: Secondary | ICD-10-CM | POA: Diagnosis not present

## 2018-12-06 DIAGNOSIS — N189 Chronic kidney disease, unspecified: Secondary | ICD-10-CM | POA: Diagnosis not present

## 2018-12-06 DIAGNOSIS — Z6833 Body mass index (BMI) 33.0-33.9, adult: Secondary | ICD-10-CM | POA: Diagnosis not present

## 2018-12-06 DIAGNOSIS — J449 Chronic obstructive pulmonary disease, unspecified: Secondary | ICD-10-CM | POA: Diagnosis not present

## 2018-12-06 DIAGNOSIS — R7303 Prediabetes: Secondary | ICD-10-CM | POA: Diagnosis not present

## 2018-12-06 DIAGNOSIS — Z720 Tobacco use: Secondary | ICD-10-CM | POA: Diagnosis not present

## 2018-12-06 DIAGNOSIS — I10 Essential (primary) hypertension: Secondary | ICD-10-CM | POA: Diagnosis not present

## 2018-12-06 DIAGNOSIS — Z139 Encounter for screening, unspecified: Secondary | ICD-10-CM | POA: Diagnosis not present

## 2018-12-06 DIAGNOSIS — E785 Hyperlipidemia, unspecified: Secondary | ICD-10-CM | POA: Diagnosis not present

## 2018-12-06 DIAGNOSIS — D539 Nutritional anemia, unspecified: Secondary | ICD-10-CM | POA: Diagnosis not present

## 2019-01-28 DIAGNOSIS — M5136 Other intervertebral disc degeneration, lumbar region: Secondary | ICD-10-CM | POA: Diagnosis not present

## 2019-01-28 DIAGNOSIS — M47816 Spondylosis without myelopathy or radiculopathy, lumbar region: Secondary | ICD-10-CM | POA: Diagnosis not present

## 2019-01-28 DIAGNOSIS — M4316 Spondylolisthesis, lumbar region: Secondary | ICD-10-CM | POA: Diagnosis not present

## 2019-01-28 DIAGNOSIS — M48062 Spinal stenosis, lumbar region with neurogenic claudication: Secondary | ICD-10-CM | POA: Diagnosis not present

## 2019-02-03 DIAGNOSIS — L821 Other seborrheic keratosis: Secondary | ICD-10-CM | POA: Diagnosis not present

## 2019-02-03 DIAGNOSIS — L570 Actinic keratosis: Secondary | ICD-10-CM | POA: Diagnosis not present

## 2019-02-03 DIAGNOSIS — L578 Other skin changes due to chronic exposure to nonionizing radiation: Secondary | ICD-10-CM | POA: Diagnosis not present

## 2019-02-13 DIAGNOSIS — M48061 Spinal stenosis, lumbar region without neurogenic claudication: Secondary | ICD-10-CM | POA: Diagnosis not present

## 2019-02-13 DIAGNOSIS — M4726 Other spondylosis with radiculopathy, lumbar region: Secondary | ICD-10-CM | POA: Diagnosis not present

## 2019-02-13 DIAGNOSIS — M5136 Other intervertebral disc degeneration, lumbar region: Secondary | ICD-10-CM | POA: Diagnosis not present

## 2019-02-24 DIAGNOSIS — Z6833 Body mass index (BMI) 33.0-33.9, adult: Secondary | ICD-10-CM | POA: Diagnosis not present

## 2019-02-24 DIAGNOSIS — E785 Hyperlipidemia, unspecified: Secondary | ICD-10-CM | POA: Diagnosis not present

## 2019-02-24 DIAGNOSIS — Z1331 Encounter for screening for depression: Secondary | ICD-10-CM | POA: Diagnosis not present

## 2019-02-24 DIAGNOSIS — Z9181 History of falling: Secondary | ICD-10-CM | POA: Diagnosis not present

## 2019-02-24 DIAGNOSIS — Z125 Encounter for screening for malignant neoplasm of prostate: Secondary | ICD-10-CM | POA: Diagnosis not present

## 2019-02-24 DIAGNOSIS — Z0000 Encounter for general adult medical examination without abnormal findings: Secondary | ICD-10-CM | POA: Diagnosis not present

## 2019-03-11 DIAGNOSIS — I2510 Atherosclerotic heart disease of native coronary artery without angina pectoris: Secondary | ICD-10-CM | POA: Diagnosis not present

## 2019-03-11 DIAGNOSIS — F17210 Nicotine dependence, cigarettes, uncomplicated: Secondary | ICD-10-CM | POA: Diagnosis not present

## 2019-03-11 DIAGNOSIS — N400 Enlarged prostate without lower urinary tract symptoms: Secondary | ICD-10-CM | POA: Diagnosis not present

## 2019-03-11 DIAGNOSIS — M4186 Other forms of scoliosis, lumbar region: Secondary | ICD-10-CM | POA: Diagnosis not present

## 2019-03-11 DIAGNOSIS — R102 Pelvic and perineal pain: Secondary | ICD-10-CM | POA: Diagnosis not present

## 2019-03-11 DIAGNOSIS — K5730 Diverticulosis of large intestine without perforation or abscess without bleeding: Secondary | ICD-10-CM | POA: Diagnosis not present

## 2019-03-11 DIAGNOSIS — K4090 Unilateral inguinal hernia, without obstruction or gangrene, not specified as recurrent: Secondary | ICD-10-CM | POA: Diagnosis not present

## 2019-03-11 DIAGNOSIS — R339 Retention of urine, unspecified: Secondary | ICD-10-CM | POA: Diagnosis not present

## 2019-03-11 DIAGNOSIS — N1330 Unspecified hydronephrosis: Secondary | ICD-10-CM | POA: Diagnosis not present

## 2019-03-11 DIAGNOSIS — M47816 Spondylosis without myelopathy or radiculopathy, lumbar region: Secondary | ICD-10-CM | POA: Diagnosis not present

## 2019-03-11 DIAGNOSIS — I700 Atherosclerosis of aorta: Secondary | ICD-10-CM | POA: Diagnosis not present

## 2019-03-13 DIAGNOSIS — R339 Retention of urine, unspecified: Secondary | ICD-10-CM | POA: Diagnosis not present

## 2019-03-14 DIAGNOSIS — Z6833 Body mass index (BMI) 33.0-33.9, adult: Secondary | ICD-10-CM | POA: Diagnosis not present

## 2019-03-14 DIAGNOSIS — Z23 Encounter for immunization: Secondary | ICD-10-CM | POA: Diagnosis not present

## 2019-03-14 DIAGNOSIS — I10 Essential (primary) hypertension: Secondary | ICD-10-CM | POA: Diagnosis not present

## 2019-03-14 DIAGNOSIS — R7303 Prediabetes: Secondary | ICD-10-CM | POA: Diagnosis not present

## 2019-03-14 DIAGNOSIS — E785 Hyperlipidemia, unspecified: Secondary | ICD-10-CM | POA: Diagnosis not present

## 2019-03-14 DIAGNOSIS — D539 Nutritional anemia, unspecified: Secondary | ICD-10-CM | POA: Diagnosis not present

## 2019-03-14 DIAGNOSIS — N189 Chronic kidney disease, unspecified: Secondary | ICD-10-CM | POA: Diagnosis not present

## 2019-03-14 DIAGNOSIS — J449 Chronic obstructive pulmonary disease, unspecified: Secondary | ICD-10-CM | POA: Diagnosis not present

## 2019-03-18 DIAGNOSIS — Z20828 Contact with and (suspected) exposure to other viral communicable diseases: Secondary | ICD-10-CM | POA: Diagnosis not present

## 2019-03-18 DIAGNOSIS — R339 Retention of urine, unspecified: Secondary | ICD-10-CM | POA: Diagnosis not present

## 2019-03-18 DIAGNOSIS — Z01812 Encounter for preprocedural laboratory examination: Secondary | ICD-10-CM | POA: Diagnosis not present

## 2019-03-21 DIAGNOSIS — R339 Retention of urine, unspecified: Secondary | ICD-10-CM | POA: Diagnosis not present

## 2019-03-21 DIAGNOSIS — Z01810 Encounter for preprocedural cardiovascular examination: Secondary | ICD-10-CM | POA: Diagnosis not present

## 2019-03-21 DIAGNOSIS — I517 Cardiomegaly: Secondary | ICD-10-CM | POA: Diagnosis not present

## 2019-03-24 DIAGNOSIS — N400 Enlarged prostate without lower urinary tract symptoms: Secondary | ICD-10-CM | POA: Diagnosis not present

## 2019-03-24 DIAGNOSIS — R339 Retention of urine, unspecified: Secondary | ICD-10-CM | POA: Diagnosis not present

## 2019-03-24 DIAGNOSIS — D400 Neoplasm of uncertain behavior of prostate: Secondary | ICD-10-CM | POA: Diagnosis not present

## 2019-03-24 DIAGNOSIS — N3289 Other specified disorders of bladder: Secondary | ICD-10-CM | POA: Diagnosis not present

## 2019-03-24 DIAGNOSIS — N1330 Unspecified hydronephrosis: Secondary | ICD-10-CM | POA: Diagnosis not present

## 2019-03-24 DIAGNOSIS — R338 Other retention of urine: Secondary | ICD-10-CM | POA: Diagnosis not present

## 2019-03-24 DIAGNOSIS — N401 Enlarged prostate with lower urinary tract symptoms: Secondary | ICD-10-CM | POA: Diagnosis not present

## 2019-04-03 DIAGNOSIS — E782 Mixed hyperlipidemia: Secondary | ICD-10-CM | POA: Diagnosis not present

## 2019-04-03 DIAGNOSIS — I10 Essential (primary) hypertension: Secondary | ICD-10-CM | POA: Diagnosis not present

## 2019-04-03 DIAGNOSIS — I2510 Atherosclerotic heart disease of native coronary artery without angina pectoris: Secondary | ICD-10-CM | POA: Diagnosis not present

## 2019-04-03 DIAGNOSIS — Z720 Tobacco use: Secondary | ICD-10-CM | POA: Diagnosis not present

## 2019-04-03 DIAGNOSIS — Z7982 Long term (current) use of aspirin: Secondary | ICD-10-CM | POA: Diagnosis not present

## 2019-04-03 DIAGNOSIS — I350 Nonrheumatic aortic (valve) stenosis: Secondary | ICD-10-CM | POA: Diagnosis not present

## 2019-04-08 DIAGNOSIS — Z87898 Personal history of other specified conditions: Secondary | ICD-10-CM | POA: Diagnosis not present

## 2019-04-08 DIAGNOSIS — R339 Retention of urine, unspecified: Secondary | ICD-10-CM | POA: Diagnosis not present

## 2019-05-19 DIAGNOSIS — M48062 Spinal stenosis, lumbar region with neurogenic claudication: Secondary | ICD-10-CM | POA: Diagnosis not present

## 2019-05-19 DIAGNOSIS — M5136 Other intervertebral disc degeneration, lumbar region: Secondary | ICD-10-CM | POA: Diagnosis not present

## 2019-05-19 DIAGNOSIS — M47816 Spondylosis without myelopathy or radiculopathy, lumbar region: Secondary | ICD-10-CM | POA: Diagnosis not present

## 2019-05-19 DIAGNOSIS — M4316 Spondylolisthesis, lumbar region: Secondary | ICD-10-CM | POA: Diagnosis not present

## 2019-05-22 DIAGNOSIS — M48061 Spinal stenosis, lumbar region without neurogenic claudication: Secondary | ICD-10-CM | POA: Diagnosis not present

## 2019-05-22 DIAGNOSIS — M4726 Other spondylosis with radiculopathy, lumbar region: Secondary | ICD-10-CM | POA: Diagnosis not present

## 2019-05-22 DIAGNOSIS — M5136 Other intervertebral disc degeneration, lumbar region: Secondary | ICD-10-CM | POA: Diagnosis not present

## 2019-06-18 DIAGNOSIS — R339 Retention of urine, unspecified: Secondary | ICD-10-CM | POA: Diagnosis not present

## 2019-07-15 DIAGNOSIS — I10 Essential (primary) hypertension: Secondary | ICD-10-CM | POA: Diagnosis not present

## 2019-07-15 DIAGNOSIS — R7303 Prediabetes: Secondary | ICD-10-CM | POA: Diagnosis not present

## 2019-07-15 DIAGNOSIS — D539 Nutritional anemia, unspecified: Secondary | ICD-10-CM | POA: Diagnosis not present

## 2019-07-15 DIAGNOSIS — E785 Hyperlipidemia, unspecified: Secondary | ICD-10-CM | POA: Diagnosis not present

## 2019-07-22 DIAGNOSIS — J019 Acute sinusitis, unspecified: Secondary | ICD-10-CM | POA: Diagnosis not present

## 2019-07-22 DIAGNOSIS — D539 Nutritional anemia, unspecified: Secondary | ICD-10-CM | POA: Diagnosis not present

## 2019-07-22 DIAGNOSIS — E785 Hyperlipidemia, unspecified: Secondary | ICD-10-CM | POA: Diagnosis not present

## 2019-07-22 DIAGNOSIS — I10 Essential (primary) hypertension: Secondary | ICD-10-CM | POA: Diagnosis not present

## 2019-07-22 DIAGNOSIS — R7303 Prediabetes: Secondary | ICD-10-CM | POA: Diagnosis not present

## 2019-07-22 DIAGNOSIS — R339 Retention of urine, unspecified: Secondary | ICD-10-CM | POA: Diagnosis not present

## 2019-07-22 DIAGNOSIS — J449 Chronic obstructive pulmonary disease, unspecified: Secondary | ICD-10-CM | POA: Diagnosis not present

## 2019-07-22 DIAGNOSIS — N189 Chronic kidney disease, unspecified: Secondary | ICD-10-CM | POA: Diagnosis not present

## 2019-07-22 DIAGNOSIS — Z6833 Body mass index (BMI) 33.0-33.9, adult: Secondary | ICD-10-CM | POA: Diagnosis not present

## 2019-07-22 DIAGNOSIS — B9689 Other specified bacterial agents as the cause of diseases classified elsewhere: Secondary | ICD-10-CM | POA: Diagnosis not present

## 2019-08-21 ENCOUNTER — Encounter: Payer: Self-pay | Admitting: Podiatry

## 2019-08-21 ENCOUNTER — Ambulatory Visit (INDEPENDENT_AMBULATORY_CARE_PROVIDER_SITE_OTHER): Payer: PPO

## 2019-08-21 ENCOUNTER — Other Ambulatory Visit: Payer: Self-pay

## 2019-08-21 ENCOUNTER — Ambulatory Visit: Payer: Medicare Other | Admitting: Podiatry

## 2019-08-21 VITALS — BP 116/63 | HR 96 | Temp 97.6°F | Resp 16

## 2019-08-21 DIAGNOSIS — I999 Unspecified disorder of circulatory system: Secondary | ICD-10-CM | POA: Diagnosis not present

## 2019-08-21 DIAGNOSIS — M2012 Hallux valgus (acquired), left foot: Secondary | ICD-10-CM | POA: Diagnosis not present

## 2019-08-21 DIAGNOSIS — M2011 Hallux valgus (acquired), right foot: Secondary | ICD-10-CM

## 2019-08-21 NOTE — Progress Notes (Signed)
   Subjective:    Patient ID: Dylan Green, male    DOB: 1945/12/16, 74 y.o.   MRN: [id number]  HPI    Review of Systems  All other systems reviewed and are negative.      Objective:   Physical Exam        Assessment & Plan:

## 2019-08-21 NOTE — Progress Notes (Signed)
Subjective:   Patient ID: Dylan Green, male   DOB: 74 y.o.   MRN: [id number]   HPI Patient presents stating he has had a bunion deformity right and of bunion on the outside of the right and states that is been going on for over 2 years and gradually becoming more irritated.  Patient does seem to get some cramping in his legs with extended walking and states he is not been walking a lot and patient does smoke 1/2 pack/day and has been doing this for years   Review of Systems  All other systems reviewed and are negative.       Objective:  Physical Exam Vitals and nursing note reviewed.  Constitutional:      Appearance: He is well-developed.  Pulmonary:     Effort: Pulmonary effort is normal.  Musculoskeletal:        General: Normal range of motion.  Skin:    General: Skin is warm.  Neurological:     Mental Status: He is alert.     Vascular status shows diminishment of pulses DP PT bilateral with no other vascular pathology currently noted except for his possible low-grade claudication symptomatology.  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  Patient is scheduled to see vascular lab to have circulatory studies done and we will get results and to see whether or not we could consider surgical intervention in his case  X-rays indicate that there is moderate bunion deformity bilateral tailor's bunion deformity right

## 2019-08-21 NOTE — Patient Instructions (Signed)
Bunion  A bunion is a bump on the base of the big toe that forms when the bones of the big toe joint move out of position. Bunions may be small at first, but they often get larger over time. They can make walking painful. What are the causes? A bunion may be caused by:  Wearing narrow or pointed shoes that force the big toe to press against the other toes.  Abnormal foot development that causes the foot to roll inward (pronate).  Changes in the foot that are caused by certain diseases, such as rheumatoid arthritis or polio.  A foot injury. What increases the risk? The following factors may make you more likely to develop this condition:  Wearing shoes that squeeze the toes together.  Having certain diseases, such as: ? Rheumatoid arthritis. ? Polio. ? Cerebral palsy.  Having family members who have bunions.  Being born with a foot deformity, such as flat feet or low arches.  Doing activities that put a lot of pressure on the feet, such as ballet dancing. What are the signs or symptoms? The main symptom of a bunion is a noticeable bump on the big toe. Other symptoms may include:  Pain.  Swelling around the big toe.  Redness and inflammation.  Thick or hardened skin on the big toe or between the toes.  Stiffness or loss of motion in the big toe.  Trouble with walking. How is this diagnosed? A bunion may be diagnosed based on your symptoms, medical history, and activities. You may have tests, such as:  X-rays. These allow your health care provider to check the position of the bones in your foot and look for damage to your joint. They also help your health care provider determine the severity of your bunion and the best way to treat it.  Joint aspiration. In this test, a sample of fluid is removed from the toe joint. This test may be done if you are in a lot of pain. It helps rule out diseases that cause painful swelling of the joints, such as arthritis. How is this  treated? Treatment depends on the severity of your symptoms. The goal of treatment is to relieve symptoms and prevent the bunion from getting worse. Your health care provider may recommend:  Wearing shoes that have a wide toe box.  Using bunion pads to cushion the affected area.  Taping your toes together to keep them in a normal position.  Placing a device inside your shoe (orthotics) to help reduce pressure on your toe joint.  Taking medicine to ease pain, inflammation, and swelling.  Applying heat or ice to the affected area.  Doing stretching exercises.  Surgery to remove scar tissue and move the toes back into their normal position. This treatment is rare. Follow these instructions at home: Managing pain, stiffness, and swelling   If directed, put ice on the painful area: ? Put ice in a plastic bag. ? Place a towel between your skin and the bag. ? Leave the ice on for 20 minutes, 2-3 times a day. Activity   If directed, apply heat to the affected area before you exercise. Use the heat source that your health care provider recommends, such as a moist heat pack or a heating pad. ? Place a towel between your skin and the heat source. ? Leave the heat on for 20-30 minutes. ? Remove the heat if your skin turns bright red. This is especially important if you are unable to feel pain,   heat, or cold. You may have a greater risk of getting burned.  Do exercises as told by your health care provider. General instructions  Support your toe joint with proper footwear, shoe padding, or taping as told by your health care provider.  Take over-the-counter and prescription medicines only as told by your health care provider.  Keep all follow-up visits as told by your health care provider. This is important. Contact a health care provider if your symptoms:  Get worse.  Do not improve in 2 weeks. Get help right away if you have:  Severe pain and trouble with walking. Summary  A  bunion is a bump on the base of the big toe that forms when the bones of the big toe joint move out of position.  Bunions can make walking painful.  Treatment depends on the severity of your symptoms.  Support your toe joint with proper footwear, shoe padding, or taping as told by your health care provider. This information is not intended to replace advice given to you by your health care provider. Make sure you discuss any questions you have with your health care provider. Document Revised: 12/03/2017 Document Reviewed: 10/09/2017 Elsevier Patient Education  2020 [employer].  

## 2019-08-22 ENCOUNTER — Telehealth: Payer: Self-pay | Admitting: *Deleted

## 2019-08-22 DIAGNOSIS — I999 Unspecified disorder of circulatory system: Secondary | ICD-10-CM

## 2019-08-22 NOTE — Telephone Encounter (Signed)
Faxed orders to [facility]. 

## 2019-09-16 DIAGNOSIS — M48062 Spinal stenosis, lumbar region with neurogenic claudication: Secondary | ICD-10-CM | POA: Diagnosis not present

## 2019-09-16 DIAGNOSIS — M4316 Spondylolisthesis, lumbar region: Secondary | ICD-10-CM | POA: Diagnosis not present

## 2019-09-16 DIAGNOSIS — M47816 Spondylosis without myelopathy or radiculopathy, lumbar region: Secondary | ICD-10-CM | POA: Diagnosis not present

## 2019-09-16 DIAGNOSIS — M5136 Other intervertebral disc degeneration, lumbar region: Secondary | ICD-10-CM | POA: Diagnosis not present

## 2019-09-25 ENCOUNTER — Telehealth: Payer: Self-pay

## 2019-09-25 DIAGNOSIS — M4726 Other spondylosis with radiculopathy, lumbar region: Secondary | ICD-10-CM | POA: Diagnosis not present

## 2019-09-25 DIAGNOSIS — M5116 Intervertebral disc disorders with radiculopathy, lumbar region: Secondary | ICD-10-CM | POA: Diagnosis not present

## 2019-09-25 DIAGNOSIS — M48061 Spinal stenosis, lumbar region without neurogenic claudication: Secondary | ICD-10-CM | POA: Diagnosis not present

## 2019-09-25 NOTE — Telephone Encounter (Signed)
Patient's caregiver called, concerned that they have not heard anything about his vascular studies. They have not been scheduled and she thinks we forgot about it. Looks like orders were faxed on 08/22/19 Thanks

## 2019-09-25 NOTE — Telephone Encounter (Signed)
I reviewed hardcopy of faxes to [facility] and found confirmation at the orders had been received by [facility] 08/22/2019. Refaxed with note to [facility], to contact pt asap.

## 2019-10-01 ENCOUNTER — Other Ambulatory Visit: Payer: Self-pay

## 2019-10-01 ENCOUNTER — Ambulatory Visit (HOSPITAL_COMMUNITY)
Admission: RE | Admit: 2019-10-01 | Discharge: 2019-10-01 | Disposition: A | Discharge status: Home / Self Care | Payer: PPO | Source: Ambulatory Visit | Attending: Cardiology | Admitting: Cardiology

## 2019-10-01 DIAGNOSIS — R0989 Other specified symptoms and signs involving the circulatory and respiratory systems: Secondary | ICD-10-CM

## 2019-10-01 DIAGNOSIS — I999 Unspecified disorder of circulatory system: Secondary | ICD-10-CM | POA: Diagnosis not present

## 2019-10-02 DIAGNOSIS — I2510 Atherosclerotic heart disease of native coronary artery without angina pectoris: Secondary | ICD-10-CM | POA: Diagnosis not present

## 2019-10-02 DIAGNOSIS — Z6833 Body mass index (BMI) 33.0-33.9, adult: Secondary | ICD-10-CM | POA: Diagnosis not present

## 2019-10-02 DIAGNOSIS — E782 Mixed hyperlipidemia: Secondary | ICD-10-CM | POA: Diagnosis not present

## 2019-10-02 DIAGNOSIS — R0600 Dyspnea, unspecified: Secondary | ICD-10-CM | POA: Diagnosis not present

## 2019-10-02 DIAGNOSIS — R0981 Nasal congestion: Secondary | ICD-10-CM | POA: Diagnosis not present

## 2019-10-02 DIAGNOSIS — I10 Essential (primary) hypertension: Secondary | ICD-10-CM | POA: Diagnosis not present

## 2019-10-02 DIAGNOSIS — Z720 Tobacco use: Secondary | ICD-10-CM | POA: Diagnosis not present

## 2019-10-02 DIAGNOSIS — Z7982 Long term (current) use of aspirin: Secondary | ICD-10-CM | POA: Diagnosis not present

## 2019-10-02 DIAGNOSIS — J180 Bronchopneumonia, unspecified organism: Secondary | ICD-10-CM | POA: Diagnosis not present

## 2019-10-02 DIAGNOSIS — I350 Nonrheumatic aortic (valve) stenosis: Secondary | ICD-10-CM | POA: Diagnosis not present

## 2019-10-09 DIAGNOSIS — I2510 Atherosclerotic heart disease of native coronary artery without angina pectoris: Secondary | ICD-10-CM | POA: Diagnosis not present

## 2019-10-09 DIAGNOSIS — I350 Nonrheumatic aortic (valve) stenosis: Secondary | ICD-10-CM | POA: Diagnosis not present

## 2019-10-15 ENCOUNTER — Other Ambulatory Visit: Payer: Self-pay

## 2019-10-15 ENCOUNTER — Ambulatory Visit: Payer: PPO | Admitting: Podiatry

## 2019-10-15 ENCOUNTER — Encounter: Payer: Self-pay | Admitting: Podiatry

## 2019-10-15 VITALS — Temp 98.3°F

## 2019-10-15 DIAGNOSIS — M21621 Bunionette of right foot: Secondary | ICD-10-CM

## 2019-10-15 DIAGNOSIS — M779 Enthesopathy, unspecified: Secondary | ICD-10-CM

## 2019-10-15 DIAGNOSIS — M2011 Hallux valgus (acquired), right foot: Secondary | ICD-10-CM | POA: Diagnosis not present

## 2019-10-15 DIAGNOSIS — M2012 Hallux valgus (acquired), left foot: Secondary | ICD-10-CM | POA: Diagnosis not present

## 2019-10-15 NOTE — Progress Notes (Signed)
Subjective:   Patient ID: Dylan Green, male   DOB: 74 y.o.   MRN: [id number]   HPI Patient presents stating that I am still having a lot of problems with my right foot and I would like to have it fixed as I tried wider shoes I tried soaks and other modalities.  I did have my vascular studies completed   ROS      Objective:  Physical Exam  Neurovascular status was found to be unchanged with work indicating that he does have adequate pulses upon testing.  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  I recommended a distal osteotomy along with metatarsal head resection and patient wants this done but wants to wait until the fall to do this.  Today he does have inflammation in these joints so I did inject inject the capsule of the first MPJ 3 mg Kenalog 5 mg Xylocaine and around the fifth MPJ 3 mg Kenalog 5 mg Xylocaine.  I educated him on bunion deformity and what would be required from a surgical perspective

## 2019-10-20 DIAGNOSIS — J309 Allergic rhinitis, unspecified: Secondary | ICD-10-CM | POA: Diagnosis not present

## 2019-10-20 DIAGNOSIS — J449 Chronic obstructive pulmonary disease, unspecified: Secondary | ICD-10-CM | POA: Diagnosis not present

## 2019-10-20 DIAGNOSIS — J209 Acute bronchitis, unspecified: Secondary | ICD-10-CM | POA: Diagnosis not present

## 2019-10-20 DIAGNOSIS — B9689 Other specified bacterial agents as the cause of diseases classified elsewhere: Secondary | ICD-10-CM | POA: Diagnosis not present

## 2019-10-20 DIAGNOSIS — J019 Acute sinusitis, unspecified: Secondary | ICD-10-CM | POA: Diagnosis not present

## 2019-10-20 DIAGNOSIS — Z6834 Body mass index (BMI) 34.0-34.9, adult: Secondary | ICD-10-CM | POA: Diagnosis not present

## 2019-10-22 DIAGNOSIS — Z720 Tobacco use: Secondary | ICD-10-CM | POA: Diagnosis not present

## 2019-10-22 DIAGNOSIS — I25118 Atherosclerotic heart disease of native coronary artery with other forms of angina pectoris: Secondary | ICD-10-CM | POA: Diagnosis not present

## 2019-10-22 DIAGNOSIS — I350 Nonrheumatic aortic (valve) stenosis: Secondary | ICD-10-CM | POA: Diagnosis not present

## 2019-10-22 DIAGNOSIS — I10 Essential (primary) hypertension: Secondary | ICD-10-CM | POA: Diagnosis not present

## 2019-10-22 DIAGNOSIS — E7800 Pure hypercholesterolemia, unspecified: Secondary | ICD-10-CM | POA: Diagnosis not present

## 2019-10-28 DIAGNOSIS — R0602 Shortness of breath: Secondary | ICD-10-CM | POA: Diagnosis not present

## 2019-10-28 DIAGNOSIS — I350 Nonrheumatic aortic (valve) stenosis: Secondary | ICD-10-CM | POA: Diagnosis not present

## 2019-10-28 DIAGNOSIS — I272 Pulmonary hypertension, unspecified: Secondary | ICD-10-CM | POA: Diagnosis not present

## 2019-10-28 DIAGNOSIS — I10 Essential (primary) hypertension: Secondary | ICD-10-CM | POA: Diagnosis not present

## 2019-10-28 DIAGNOSIS — I2510 Atherosclerotic heart disease of native coronary artery without angina pectoris: Secondary | ICD-10-CM | POA: Diagnosis not present

## 2019-10-28 DIAGNOSIS — F17210 Nicotine dependence, cigarettes, uncomplicated: Secondary | ICD-10-CM | POA: Diagnosis not present

## 2019-10-28 DIAGNOSIS — Z01818 Encounter for other preprocedural examination: Secondary | ICD-10-CM | POA: Diagnosis not present

## 2019-10-28 DIAGNOSIS — E785 Hyperlipidemia, unspecified: Secondary | ICD-10-CM | POA: Diagnosis not present

## 2019-10-28 DIAGNOSIS — Z0389 Encounter for observation for other suspected diseases and conditions ruled out: Secondary | ICD-10-CM | POA: Diagnosis not present

## 2019-10-28 DIAGNOSIS — Z955 Presence of coronary angioplasty implant and graft: Secondary | ICD-10-CM | POA: Diagnosis not present

## 2019-10-28 DIAGNOSIS — J449 Chronic obstructive pulmonary disease, unspecified: Secondary | ICD-10-CM | POA: Diagnosis not present

## 2019-10-28 DIAGNOSIS — I25118 Atherosclerotic heart disease of native coronary artery with other forms of angina pectoris: Secondary | ICD-10-CM | POA: Diagnosis not present

## 2019-11-11 DIAGNOSIS — Z955 Presence of coronary angioplasty implant and graft: Secondary | ICD-10-CM | POA: Diagnosis not present

## 2019-11-11 DIAGNOSIS — I77810 Thoracic aortic ectasia: Secondary | ICD-10-CM | POA: Diagnosis not present

## 2019-11-11 DIAGNOSIS — F17210 Nicotine dependence, cigarettes, uncomplicated: Secondary | ICD-10-CM | POA: Diagnosis not present

## 2019-11-11 DIAGNOSIS — I2510 Atherosclerotic heart disease of native coronary artery without angina pectoris: Secondary | ICD-10-CM | POA: Diagnosis not present

## 2019-11-11 DIAGNOSIS — I350 Nonrheumatic aortic (valve) stenosis: Secondary | ICD-10-CM | POA: Diagnosis not present

## 2019-11-13 DIAGNOSIS — E785 Hyperlipidemia, unspecified: Secondary | ICD-10-CM | POA: Diagnosis not present

## 2019-11-13 DIAGNOSIS — D539 Nutritional anemia, unspecified: Secondary | ICD-10-CM | POA: Diagnosis not present

## 2019-11-13 DIAGNOSIS — I10 Essential (primary) hypertension: Secondary | ICD-10-CM | POA: Diagnosis not present

## 2019-11-17 DIAGNOSIS — M5136 Other intervertebral disc degeneration, lumbar region: Secondary | ICD-10-CM | POA: Diagnosis not present

## 2019-11-17 DIAGNOSIS — M4316 Spondylolisthesis, lumbar region: Secondary | ICD-10-CM | POA: Diagnosis not present

## 2019-11-17 DIAGNOSIS — M48062 Spinal stenosis, lumbar region with neurogenic claudication: Secondary | ICD-10-CM | POA: Diagnosis not present

## 2019-11-20 DIAGNOSIS — I350 Nonrheumatic aortic (valve) stenosis: Secondary | ICD-10-CM | POA: Diagnosis not present

## 2019-11-20 DIAGNOSIS — J449 Chronic obstructive pulmonary disease, unspecified: Secondary | ICD-10-CM | POA: Diagnosis not present

## 2019-11-20 DIAGNOSIS — E785 Hyperlipidemia, unspecified: Secondary | ICD-10-CM | POA: Diagnosis not present

## 2019-11-20 DIAGNOSIS — I10 Essential (primary) hypertension: Secondary | ICD-10-CM | POA: Diagnosis not present

## 2019-11-20 DIAGNOSIS — R7303 Prediabetes: Secondary | ICD-10-CM | POA: Diagnosis not present

## 2019-11-20 DIAGNOSIS — D539 Nutritional anemia, unspecified: Secondary | ICD-10-CM | POA: Diagnosis not present

## 2019-11-20 DIAGNOSIS — Z6833 Body mass index (BMI) 33.0-33.9, adult: Secondary | ICD-10-CM | POA: Diagnosis not present

## 2019-11-20 DIAGNOSIS — N189 Chronic kidney disease, unspecified: Secondary | ICD-10-CM | POA: Diagnosis not present

## 2019-11-27 DIAGNOSIS — K5790 Diverticulosis of intestine, part unspecified, without perforation or abscess without bleeding: Secondary | ICD-10-CM | POA: Diagnosis not present

## 2019-11-27 DIAGNOSIS — G2581 Restless legs syndrome: Secondary | ICD-10-CM | POA: Diagnosis not present

## 2019-11-27 DIAGNOSIS — M549 Dorsalgia, unspecified: Secondary | ICD-10-CM | POA: Diagnosis not present

## 2019-11-27 DIAGNOSIS — G8929 Other chronic pain: Secondary | ICD-10-CM | POA: Diagnosis not present

## 2019-11-27 DIAGNOSIS — K219 Gastro-esophageal reflux disease without esophagitis: Secondary | ICD-10-CM | POA: Diagnosis not present

## 2019-11-27 DIAGNOSIS — D509 Iron deficiency anemia, unspecified: Secondary | ICD-10-CM | POA: Diagnosis not present

## 2019-11-27 DIAGNOSIS — Z79899 Other long term (current) drug therapy: Secondary | ICD-10-CM | POA: Diagnosis not present

## 2019-11-27 DIAGNOSIS — K649 Unspecified hemorrhoids: Secondary | ICD-10-CM | POA: Diagnosis not present

## 2019-11-27 DIAGNOSIS — Z955 Presence of coronary angioplasty implant and graft: Secondary | ICD-10-CM | POA: Diagnosis not present

## 2019-11-27 DIAGNOSIS — M1990 Unspecified osteoarthritis, unspecified site: Secondary | ICD-10-CM | POA: Diagnosis not present

## 2019-11-27 DIAGNOSIS — I25118 Atherosclerotic heart disease of native coronary artery with other forms of angina pectoris: Secondary | ICD-10-CM | POA: Diagnosis not present

## 2019-11-27 DIAGNOSIS — I252 Old myocardial infarction: Secondary | ICD-10-CM | POA: Diagnosis not present

## 2019-11-27 DIAGNOSIS — H9190 Unspecified hearing loss, unspecified ear: Secondary | ICD-10-CM | POA: Diagnosis not present

## 2019-11-27 DIAGNOSIS — Z86010 Personal history of colonic polyps: Secondary | ICD-10-CM | POA: Diagnosis not present

## 2019-11-27 DIAGNOSIS — Q231 Congenital insufficiency of aortic valve: Secondary | ICD-10-CM | POA: Diagnosis not present

## 2019-11-27 DIAGNOSIS — I442 Atrioventricular block, complete: Secondary | ICD-10-CM | POA: Diagnosis not present

## 2019-11-27 DIAGNOSIS — I10 Essential (primary) hypertension: Secondary | ICD-10-CM | POA: Diagnosis not present

## 2019-11-27 DIAGNOSIS — E782 Mixed hyperlipidemia: Secondary | ICD-10-CM | POA: Diagnosis not present

## 2019-11-27 DIAGNOSIS — F17210 Nicotine dependence, cigarettes, uncomplicated: Secondary | ICD-10-CM | POA: Diagnosis not present

## 2019-11-27 DIAGNOSIS — Z7982 Long term (current) use of aspirin: Secondary | ICD-10-CM | POA: Diagnosis not present

## 2019-12-04 DIAGNOSIS — I442 Atrioventricular block, complete: Secondary | ICD-10-CM | POA: Diagnosis not present

## 2019-12-04 DIAGNOSIS — Z950 Presence of cardiac pacemaker: Secondary | ICD-10-CM | POA: Diagnosis not present

## 2019-12-04 DIAGNOSIS — I25118 Atherosclerotic heart disease of native coronary artery with other forms of angina pectoris: Secondary | ICD-10-CM | POA: Diagnosis not present

## 2019-12-04 DIAGNOSIS — Z952 Presence of prosthetic heart valve: Secondary | ICD-10-CM | POA: Diagnosis not present

## 2019-12-04 DIAGNOSIS — Z9889 Other specified postprocedural states: Secondary | ICD-10-CM | POA: Diagnosis not present

## 2019-12-04 DIAGNOSIS — I350 Nonrheumatic aortic (valve) stenosis: Secondary | ICD-10-CM | POA: Diagnosis not present

## 2019-12-04 DIAGNOSIS — I97190 Other postprocedural cardiac functional disturbances following cardiac surgery: Secondary | ICD-10-CM | POA: Diagnosis not present

## 2019-12-04 DIAGNOSIS — I771 Stricture of artery: Secondary | ICD-10-CM | POA: Diagnosis not present

## 2019-12-05 DIAGNOSIS — I97190 Other postprocedural cardiac functional disturbances following cardiac surgery: Secondary | ICD-10-CM | POA: Diagnosis not present

## 2019-12-05 DIAGNOSIS — Z950 Presence of cardiac pacemaker: Secondary | ICD-10-CM | POA: Diagnosis not present

## 2019-12-05 DIAGNOSIS — I350 Nonrheumatic aortic (valve) stenosis: Secondary | ICD-10-CM | POA: Diagnosis not present

## 2019-12-05 DIAGNOSIS — Z952 Presence of prosthetic heart valve: Secondary | ICD-10-CM | POA: Diagnosis not present

## 2019-12-05 DIAGNOSIS — Q231 Congenital insufficiency of aortic valve: Secondary | ICD-10-CM | POA: Diagnosis not present

## 2019-12-05 DIAGNOSIS — I10 Essential (primary) hypertension: Secondary | ICD-10-CM | POA: Diagnosis not present

## 2019-12-05 DIAGNOSIS — I351 Nonrheumatic aortic (valve) insufficiency: Secondary | ICD-10-CM | POA: Diagnosis not present

## 2019-12-05 DIAGNOSIS — I442 Atrioventricular block, complete: Secondary | ICD-10-CM | POA: Diagnosis not present

## 2019-12-06 DIAGNOSIS — I442 Atrioventricular block, complete: Secondary | ICD-10-CM | POA: Diagnosis not present

## 2019-12-11 DIAGNOSIS — Z79899 Other long term (current) drug therapy: Secondary | ICD-10-CM | POA: Diagnosis not present

## 2019-12-11 DIAGNOSIS — Z6833 Body mass index (BMI) 33.0-33.9, adult: Secondary | ICD-10-CM | POA: Diagnosis not present

## 2019-12-11 DIAGNOSIS — R600 Localized edema: Secondary | ICD-10-CM | POA: Diagnosis not present

## 2019-12-11 DIAGNOSIS — I350 Nonrheumatic aortic (valve) stenosis: Secondary | ICD-10-CM | POA: Diagnosis not present

## 2019-12-11 DIAGNOSIS — Z950 Presence of cardiac pacemaker: Secondary | ICD-10-CM | POA: Diagnosis not present

## 2019-12-16 DIAGNOSIS — I2510 Atherosclerotic heart disease of native coronary artery without angina pectoris: Secondary | ICD-10-CM | POA: Diagnosis not present

## 2019-12-16 DIAGNOSIS — Z4509 Encounter for adjustment and management of other cardiac device: Secondary | ICD-10-CM | POA: Diagnosis not present

## 2019-12-16 DIAGNOSIS — Z950 Presence of cardiac pacemaker: Secondary | ICD-10-CM | POA: Diagnosis not present

## 2019-12-30 DIAGNOSIS — I208 Other forms of angina pectoris: Secondary | ICD-10-CM | POA: Diagnosis not present

## 2019-12-30 DIAGNOSIS — Z954 Presence of other heart-valve replacement: Secondary | ICD-10-CM | POA: Diagnosis not present

## 2020-01-01 DIAGNOSIS — Z952 Presence of prosthetic heart valve: Secondary | ICD-10-CM | POA: Diagnosis not present

## 2020-01-03 DIAGNOSIS — M109 Gout, unspecified: Secondary | ICD-10-CM | POA: Diagnosis not present

## 2020-01-06 ENCOUNTER — Other Ambulatory Visit: Payer: Self-pay

## 2020-01-07 DIAGNOSIS — Z952 Presence of prosthetic heart valve: Secondary | ICD-10-CM | POA: Diagnosis not present

## 2020-01-07 DIAGNOSIS — E782 Mixed hyperlipidemia: Secondary | ICD-10-CM | POA: Diagnosis not present

## 2020-01-07 DIAGNOSIS — Z950 Presence of cardiac pacemaker: Secondary | ICD-10-CM | POA: Diagnosis not present

## 2020-01-07 DIAGNOSIS — I10 Essential (primary) hypertension: Secondary | ICD-10-CM | POA: Diagnosis not present

## 2020-01-07 DIAGNOSIS — Z720 Tobacco use: Secondary | ICD-10-CM | POA: Diagnosis not present

## 2020-01-07 DIAGNOSIS — I350 Nonrheumatic aortic (valve) stenosis: Secondary | ICD-10-CM | POA: Diagnosis not present

## 2020-01-07 DIAGNOSIS — I25118 Atherosclerotic heart disease of native coronary artery with other forms of angina pectoris: Secondary | ICD-10-CM | POA: Diagnosis not present

## 2020-01-12 DIAGNOSIS — Z954 Presence of other heart-valve replacement: Secondary | ICD-10-CM | POA: Diagnosis not present

## 2020-01-12 DIAGNOSIS — I208 Other forms of angina pectoris: Secondary | ICD-10-CM | POA: Diagnosis not present

## 2020-01-15 DIAGNOSIS — I350 Nonrheumatic aortic (valve) stenosis: Secondary | ICD-10-CM | POA: Diagnosis not present

## 2020-01-19 DIAGNOSIS — S80812A Abrasion, left lower leg, initial encounter: Secondary | ICD-10-CM | POA: Diagnosis not present

## 2020-01-19 DIAGNOSIS — R600 Localized edema: Secondary | ICD-10-CM | POA: Diagnosis not present

## 2020-01-19 DIAGNOSIS — Z6834 Body mass index (BMI) 34.0-34.9, adult: Secondary | ICD-10-CM | POA: Diagnosis not present

## 2020-01-19 DIAGNOSIS — N189 Chronic kidney disease, unspecified: Secondary | ICD-10-CM | POA: Diagnosis not present

## 2020-01-19 DIAGNOSIS — I10 Essential (primary) hypertension: Secondary | ICD-10-CM | POA: Diagnosis not present

## 2020-01-19 DIAGNOSIS — D509 Iron deficiency anemia, unspecified: Secondary | ICD-10-CM | POA: Diagnosis not present

## 2020-01-19 DIAGNOSIS — M109 Gout, unspecified: Secondary | ICD-10-CM | POA: Diagnosis not present

## 2020-01-26 DIAGNOSIS — M48062 Spinal stenosis, lumbar region with neurogenic claudication: Secondary | ICD-10-CM | POA: Diagnosis not present

## 2020-02-02 DIAGNOSIS — M109 Gout, unspecified: Secondary | ICD-10-CM | POA: Diagnosis not present

## 2020-02-02 DIAGNOSIS — N189 Chronic kidney disease, unspecified: Secondary | ICD-10-CM | POA: Diagnosis not present

## 2020-02-02 DIAGNOSIS — Z6833 Body mass index (BMI) 33.0-33.9, adult: Secondary | ICD-10-CM | POA: Diagnosis not present

## 2020-02-02 DIAGNOSIS — M79605 Pain in left leg: Secondary | ICD-10-CM | POA: Diagnosis not present

## 2020-02-02 DIAGNOSIS — D539 Nutritional anemia, unspecified: Secondary | ICD-10-CM | POA: Diagnosis not present

## 2020-02-02 DIAGNOSIS — I10 Essential (primary) hypertension: Secondary | ICD-10-CM | POA: Diagnosis not present

## 2020-02-02 DIAGNOSIS — M79604 Pain in right leg: Secondary | ICD-10-CM | POA: Diagnosis not present

## 2020-02-02 DIAGNOSIS — R600 Localized edema: Secondary | ICD-10-CM | POA: Diagnosis not present

## 2020-02-03 DIAGNOSIS — L821 Other seborrheic keratosis: Secondary | ICD-10-CM | POA: Diagnosis not present

## 2020-02-03 DIAGNOSIS — L578 Other skin changes due to chronic exposure to nonionizing radiation: Secondary | ICD-10-CM | POA: Diagnosis not present

## 2020-02-03 DIAGNOSIS — R233 Spontaneous ecchymoses: Secondary | ICD-10-CM | POA: Diagnosis not present

## 2020-02-03 DIAGNOSIS — L820 Inflamed seborrheic keratosis: Secondary | ICD-10-CM | POA: Diagnosis not present

## 2020-02-04 DIAGNOSIS — Z45018 Encounter for adjustment and management of other part of cardiac pacemaker: Secondary | ICD-10-CM | POA: Diagnosis not present

## 2020-02-04 DIAGNOSIS — I442 Atrioventricular block, complete: Secondary | ICD-10-CM | POA: Diagnosis not present

## 2020-02-04 DIAGNOSIS — Z950 Presence of cardiac pacemaker: Secondary | ICD-10-CM | POA: Diagnosis not present

## 2020-02-11 DIAGNOSIS — Z954 Presence of other heart-valve replacement: Secondary | ICD-10-CM | POA: Diagnosis not present

## 2020-02-11 DIAGNOSIS — I208 Other forms of angina pectoris: Secondary | ICD-10-CM | POA: Diagnosis not present

## 2020-02-18 ENCOUNTER — Ambulatory Visit: Payer: PPO | Admitting: Podiatry

## 2020-02-19 ENCOUNTER — Ambulatory Visit: Payer: PPO | Admitting: Podiatry

## 2020-02-19 DIAGNOSIS — R6889 Other general symptoms and signs: Secondary | ICD-10-CM | POA: Diagnosis not present

## 2020-02-19 DIAGNOSIS — Z20822 Contact with and (suspected) exposure to covid-19: Secondary | ICD-10-CM | POA: Diagnosis not present

## 2020-02-19 DIAGNOSIS — B9689 Other specified bacterial agents as the cause of diseases classified elsewhere: Secondary | ICD-10-CM | POA: Diagnosis not present

## 2020-02-19 DIAGNOSIS — J019 Acute sinusitis, unspecified: Secondary | ICD-10-CM | POA: Diagnosis not present

## 2020-02-19 DIAGNOSIS — J42 Unspecified chronic bronchitis: Secondary | ICD-10-CM | POA: Diagnosis not present

## 2020-02-23 DIAGNOSIS — J42 Unspecified chronic bronchitis: Secondary | ICD-10-CM | POA: Diagnosis not present

## 2020-02-23 DIAGNOSIS — J209 Acute bronchitis, unspecified: Secondary | ICD-10-CM | POA: Diagnosis not present

## 2020-02-24 DIAGNOSIS — M5136 Other intervertebral disc degeneration, lumbar region: Secondary | ICD-10-CM | POA: Diagnosis not present

## 2020-02-24 DIAGNOSIS — M47816 Spondylosis without myelopathy or radiculopathy, lumbar region: Secondary | ICD-10-CM | POA: Diagnosis not present

## 2020-02-24 DIAGNOSIS — M48062 Spinal stenosis, lumbar region with neurogenic claudication: Secondary | ICD-10-CM | POA: Diagnosis not present

## 2020-03-01 DIAGNOSIS — J329 Chronic sinusitis, unspecified: Secondary | ICD-10-CM | POA: Diagnosis not present

## 2020-03-01 DIAGNOSIS — J209 Acute bronchitis, unspecified: Secondary | ICD-10-CM | POA: Diagnosis not present

## 2020-03-01 DIAGNOSIS — Z6833 Body mass index (BMI) 33.0-33.9, adult: Secondary | ICD-10-CM | POA: Diagnosis not present

## 2020-03-01 DIAGNOSIS — J42 Unspecified chronic bronchitis: Secondary | ICD-10-CM | POA: Diagnosis not present

## 2020-03-12 DIAGNOSIS — Z954 Presence of other heart-valve replacement: Secondary | ICD-10-CM | POA: Diagnosis not present

## 2020-03-12 DIAGNOSIS — I208 Other forms of angina pectoris: Secondary | ICD-10-CM | POA: Diagnosis not present

## 2020-03-16 DIAGNOSIS — D539 Nutritional anemia, unspecified: Secondary | ICD-10-CM | POA: Diagnosis not present

## 2020-03-16 DIAGNOSIS — E785 Hyperlipidemia, unspecified: Secondary | ICD-10-CM | POA: Diagnosis not present

## 2020-03-16 DIAGNOSIS — R7303 Prediabetes: Secondary | ICD-10-CM | POA: Diagnosis not present

## 2020-03-16 DIAGNOSIS — I10 Essential (primary) hypertension: Secondary | ICD-10-CM | POA: Diagnosis not present

## 2020-03-16 DIAGNOSIS — Z125 Encounter for screening for malignant neoplasm of prostate: Secondary | ICD-10-CM | POA: Diagnosis not present

## 2020-03-22 DIAGNOSIS — I10 Essential (primary) hypertension: Secondary | ICD-10-CM | POA: Diagnosis not present

## 2020-03-22 DIAGNOSIS — Z6833 Body mass index (BMI) 33.0-33.9, adult: Secondary | ICD-10-CM | POA: Diagnosis not present

## 2020-03-22 DIAGNOSIS — N189 Chronic kidney disease, unspecified: Secondary | ICD-10-CM | POA: Diagnosis not present

## 2020-03-22 DIAGNOSIS — E785 Hyperlipidemia, unspecified: Secondary | ICD-10-CM | POA: Diagnosis not present

## 2020-03-22 DIAGNOSIS — J329 Chronic sinusitis, unspecified: Secondary | ICD-10-CM | POA: Diagnosis not present

## 2020-03-22 DIAGNOSIS — Z9181 History of falling: Secondary | ICD-10-CM | POA: Diagnosis not present

## 2020-03-22 DIAGNOSIS — Z1331 Encounter for screening for depression: Secondary | ICD-10-CM | POA: Diagnosis not present

## 2020-03-22 DIAGNOSIS — J449 Chronic obstructive pulmonary disease, unspecified: Secondary | ICD-10-CM | POA: Diagnosis not present

## 2020-03-22 DIAGNOSIS — D539 Nutritional anemia, unspecified: Secondary | ICD-10-CM | POA: Diagnosis not present

## 2020-03-22 DIAGNOSIS — Z23 Encounter for immunization: Secondary | ICD-10-CM | POA: Diagnosis not present

## 2020-03-22 DIAGNOSIS — R7303 Prediabetes: Secondary | ICD-10-CM | POA: Diagnosis not present

## 2020-03-22 DIAGNOSIS — I350 Nonrheumatic aortic (valve) stenosis: Secondary | ICD-10-CM | POA: Diagnosis not present

## 2020-03-23 DIAGNOSIS — Z87898 Personal history of other specified conditions: Secondary | ICD-10-CM | POA: Diagnosis not present

## 2020-03-23 DIAGNOSIS — Z4502 Encounter for adjustment and management of automatic implantable cardiac defibrillator: Secondary | ICD-10-CM | POA: Diagnosis not present

## 2020-03-23 DIAGNOSIS — Z950 Presence of cardiac pacemaker: Secondary | ICD-10-CM | POA: Diagnosis not present

## 2020-04-08 DIAGNOSIS — Z7982 Long term (current) use of aspirin: Secondary | ICD-10-CM | POA: Diagnosis not present

## 2020-04-08 DIAGNOSIS — I2510 Atherosclerotic heart disease of native coronary artery without angina pectoris: Secondary | ICD-10-CM | POA: Diagnosis not present

## 2020-04-08 DIAGNOSIS — Z720 Tobacco use: Secondary | ICD-10-CM | POA: Diagnosis not present

## 2020-04-08 DIAGNOSIS — Z45018 Encounter for adjustment and management of other part of cardiac pacemaker: Secondary | ICD-10-CM | POA: Diagnosis not present

## 2020-04-08 DIAGNOSIS — Z952 Presence of prosthetic heart valve: Secondary | ICD-10-CM | POA: Diagnosis not present

## 2020-04-08 DIAGNOSIS — I10 Essential (primary) hypertension: Secondary | ICD-10-CM | POA: Diagnosis not present

## 2020-04-08 DIAGNOSIS — E782 Mixed hyperlipidemia: Secondary | ICD-10-CM | POA: Diagnosis not present

## 2020-04-12 DIAGNOSIS — Z954 Presence of other heart-valve replacement: Secondary | ICD-10-CM | POA: Diagnosis not present

## 2020-04-12 DIAGNOSIS — I208 Other forms of angina pectoris: Secondary | ICD-10-CM | POA: Diagnosis not present

## 2020-04-14 ENCOUNTER — Ambulatory Visit: Payer: PPO | Admitting: Podiatry

## 2020-04-21 ENCOUNTER — Encounter: Payer: Self-pay | Admitting: Podiatry

## 2020-04-21 ENCOUNTER — Ambulatory Visit: Payer: PPO | Admitting: Podiatry

## 2020-04-21 ENCOUNTER — Ambulatory Visit (INDEPENDENT_AMBULATORY_CARE_PROVIDER_SITE_OTHER): Payer: PPO

## 2020-04-21 ENCOUNTER — Other Ambulatory Visit: Payer: Self-pay

## 2020-04-21 DIAGNOSIS — M2011 Hallux valgus (acquired), right foot: Secondary | ICD-10-CM

## 2020-04-21 DIAGNOSIS — M21619 Bunion of unspecified foot: Secondary | ICD-10-CM

## 2020-04-21 DIAGNOSIS — M21611 Bunion of right foot: Secondary | ICD-10-CM

## 2020-04-21 DIAGNOSIS — M2012 Hallux valgus (acquired), left foot: Secondary | ICD-10-CM

## 2020-04-21 DIAGNOSIS — M21621 Bunionette of right foot: Secondary | ICD-10-CM

## 2020-04-21 NOTE — Progress Notes (Signed)
Subjective:   Patient ID: Dylan Green, male   DOB: 74 y.o.   MRN: [id number]   HPI Patient presents stating that his bunion in the bottom are really bothering me and I would like to get them fixed.  He states that it is hard to wear shoe gear comfortably and he did not respond to the injection and only got temporary relief   ROS      Objective:  Physical Exam  Neurovascular status unchanged with patient showing no signs of claudication and did have approximate 0.95 on his ABI with no indication of foot vascular disease with digital vascular disease of a moderate nature.  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  He is motivated to get something done and I did discuss reducing his smoking which he states he is trying to do.  I recommended a very simple procedure with McBride type bunionectomy and fifth metatarsal head resection and I did allow him to read consent form going over alternative treatments complications.  Patient does understand risk of doing this procedure and that there is no guarantee that this will heal and is willing to accept that wants procedure understanding complete risk and the risk of nonhealing.  He signed consent form and is scheduled for outpatient surgery and I did dispense a air fracture walker to use during the period after surgery and I want him getting used to it before surgery and finding shoes on his other foot that are comfortable.  Patient is encouraged to call with questions concerns which may arise prior to procedure

## 2020-04-23 DIAGNOSIS — D539 Nutritional anemia, unspecified: Secondary | ICD-10-CM | POA: Diagnosis not present

## 2020-04-29 DIAGNOSIS — M48062 Spinal stenosis, lumbar region with neurogenic claudication: Secondary | ICD-10-CM | POA: Diagnosis not present

## 2020-05-05 DIAGNOSIS — I442 Atrioventricular block, complete: Secondary | ICD-10-CM | POA: Diagnosis not present

## 2020-05-05 DIAGNOSIS — Z45018 Encounter for adjustment and management of other part of cardiac pacemaker: Secondary | ICD-10-CM | POA: Diagnosis not present

## 2020-05-05 DIAGNOSIS — Z952 Presence of prosthetic heart valve: Secondary | ICD-10-CM | POA: Diagnosis not present

## 2020-05-05 DIAGNOSIS — Z950 Presence of cardiac pacemaker: Secondary | ICD-10-CM | POA: Diagnosis not present

## 2020-05-19 DIAGNOSIS — M47816 Spondylosis without myelopathy or radiculopathy, lumbar region: Secondary | ICD-10-CM | POA: Diagnosis not present

## 2020-05-19 DIAGNOSIS — M5136 Other intervertebral disc degeneration, lumbar region: Secondary | ICD-10-CM | POA: Diagnosis not present

## 2020-05-28 ENCOUNTER — Telehealth: Payer: Self-pay

## 2020-05-28 NOTE — Telephone Encounter (Signed)
DOS 06/15/2020  MCBRIDE BUNIONECTOMY RT - 90502 METATARSAL OSTEOTOMY 5TH RT - 28113  RECEIVED FAX FROM [facility] [employer]  AUTH # [id number] [facility] CPT [phone] & [phone]. GOOD FROM 05/21/2020 - 08/19/2020

## 2020-06-14 MED ORDER — HYDROCODONE-ACETAMINOPHEN 10-325 MG PO TABS: 1.0000 | ORAL_TABLET | Freq: Three times a day (TID) | ORAL | 0 refills | Status: AC | PRN

## 2020-06-14 NOTE — Addendum Note (Signed)
Addended by: Lenn Sink on: 06/14/2020 05:40 PM   Modules accepted: Orders

## 2020-06-15 ENCOUNTER — Encounter: Payer: Self-pay | Admitting: Podiatry

## 2020-06-15 DIAGNOSIS — M2011 Hallux valgus (acquired), right foot: Secondary | ICD-10-CM | POA: Diagnosis not present

## 2020-06-15 DIAGNOSIS — M21621 Bunionette of right foot: Secondary | ICD-10-CM | POA: Diagnosis not present

## 2020-06-17 ENCOUNTER — Other Ambulatory Visit: Payer: Self-pay | Admitting: Podiatry

## 2020-06-20 NOTE — Telephone Encounter (Signed)
Please advise 

## 2020-06-21 ENCOUNTER — Ambulatory Visit (INDEPENDENT_AMBULATORY_CARE_PROVIDER_SITE_OTHER): Payer: PPO | Admitting: Podiatry

## 2020-06-21 ENCOUNTER — Ambulatory Visit (INDEPENDENT_AMBULATORY_CARE_PROVIDER_SITE_OTHER): Payer: PPO

## 2020-06-21 ENCOUNTER — Other Ambulatory Visit: Payer: Self-pay

## 2020-06-21 ENCOUNTER — Encounter: Payer: Self-pay | Admitting: Podiatry

## 2020-06-21 DIAGNOSIS — M21611 Bunion of right foot: Secondary | ICD-10-CM

## 2020-06-21 DIAGNOSIS — M21619 Bunion of unspecified foot: Secondary | ICD-10-CM

## 2020-06-21 NOTE — Telephone Encounter (Signed)
Great, Thank you!

## 2020-06-21 NOTE — Telephone Encounter (Signed)
Can wait until I evaluate him today

## 2020-06-22 NOTE — Progress Notes (Signed)
Subjective:   Patient ID: Dylan Green, male   DOB: 75 y.o.   MRN: [id number]   HPI Patient presents stating doing well with surgery and so far very pleased   ROS      Objective:  Physical Exam  Neurovascular status intact negative Bevelyn Buckles' sign noted wound edges coapted well good alignment noted successful reduction of deformity     Assessment:  Doing well post surgery right first fifth metatarsal     Plan:  H&P x-ray reviewed sterile dressing reapplied continue immobilization elevation compression reappoint 3 to 4 weeks or earlier if needed  X-rays indicate no indications of pathology satisfactory resection of bone right foot

## 2020-06-24 DIAGNOSIS — Z139 Encounter for screening, unspecified: Secondary | ICD-10-CM | POA: Diagnosis not present

## 2020-06-24 DIAGNOSIS — Z9181 History of falling: Secondary | ICD-10-CM | POA: Diagnosis not present

## 2020-06-24 DIAGNOSIS — Z0000 Encounter for general adult medical examination without abnormal findings: Secondary | ICD-10-CM | POA: Diagnosis not present

## 2020-06-24 DIAGNOSIS — E785 Hyperlipidemia, unspecified: Secondary | ICD-10-CM | POA: Diagnosis not present

## 2020-06-24 DIAGNOSIS — Z1331 Encounter for screening for depression: Secondary | ICD-10-CM | POA: Diagnosis not present

## 2020-06-24 DIAGNOSIS — E669 Obesity, unspecified: Secondary | ICD-10-CM | POA: Diagnosis not present

## 2020-07-07 ENCOUNTER — Ambulatory Visit (INDEPENDENT_AMBULATORY_CARE_PROVIDER_SITE_OTHER): Payer: PPO

## 2020-07-07 ENCOUNTER — Other Ambulatory Visit: Payer: Self-pay

## 2020-07-07 ENCOUNTER — Ambulatory Visit (INDEPENDENT_AMBULATORY_CARE_PROVIDER_SITE_OTHER): Payer: PPO | Admitting: Podiatry

## 2020-07-07 ENCOUNTER — Encounter: Payer: Self-pay | Admitting: Podiatry

## 2020-07-07 DIAGNOSIS — M21619 Bunion of unspecified foot: Secondary | ICD-10-CM | POA: Diagnosis not present

## 2020-07-07 DIAGNOSIS — M21611 Bunion of right foot: Secondary | ICD-10-CM

## 2020-07-07 NOTE — Progress Notes (Signed)
Subjective:   Patient ID: Dylan Green, male   DOB: 75 y.o.   MRN: [id number]   HPI Patient states he is doing well with surgery and very pleased with mild swelling   ROS      Objective:  Physical Exam  Neurovascular status intact negative Bevelyn Buckles' sign noted wound edges coapted well stitches intact good alignment noted with good reduction of deformity     Assessment:  Doing well post foot surgery right     Plan:  H&P x-ray reviewed stitches are removed wound edges coapted well applied ankle compression stocking begin gradual return to soft shoe gear reappoint to recheck  X-rays indicate satisfactory resection of bone good alignment noted

## 2020-07-14 DIAGNOSIS — D539 Nutritional anemia, unspecified: Secondary | ICD-10-CM | POA: Diagnosis not present

## 2020-07-14 DIAGNOSIS — E785 Hyperlipidemia, unspecified: Secondary | ICD-10-CM | POA: Diagnosis not present

## 2020-07-19 DIAGNOSIS — M25512 Pain in left shoulder: Secondary | ICD-10-CM | POA: Diagnosis not present

## 2020-07-23 DIAGNOSIS — D539 Nutritional anemia, unspecified: Secondary | ICD-10-CM | POA: Diagnosis not present

## 2020-07-23 DIAGNOSIS — R7303 Prediabetes: Secondary | ICD-10-CM | POA: Diagnosis not present

## 2020-07-23 DIAGNOSIS — E785 Hyperlipidemia, unspecified: Secondary | ICD-10-CM | POA: Diagnosis not present

## 2020-07-23 DIAGNOSIS — I10 Essential (primary) hypertension: Secondary | ICD-10-CM | POA: Diagnosis not present

## 2020-07-23 DIAGNOSIS — I350 Nonrheumatic aortic (valve) stenosis: Secondary | ICD-10-CM | POA: Diagnosis not present

## 2020-07-23 DIAGNOSIS — J449 Chronic obstructive pulmonary disease, unspecified: Secondary | ICD-10-CM | POA: Diagnosis not present

## 2020-07-23 DIAGNOSIS — Z6833 Body mass index (BMI) 33.0-33.9, adult: Secondary | ICD-10-CM | POA: Diagnosis not present

## 2020-07-23 DIAGNOSIS — N189 Chronic kidney disease, unspecified: Secondary | ICD-10-CM | POA: Diagnosis not present

## 2020-07-23 DIAGNOSIS — M109 Gout, unspecified: Secondary | ICD-10-CM | POA: Diagnosis not present

## 2020-07-29 DIAGNOSIS — R1319 Other dysphagia: Secondary | ICD-10-CM | POA: Diagnosis not present

## 2020-07-29 DIAGNOSIS — Z8774 Personal history of (corrected) congenital malformations of heart and circulatory system: Secondary | ICD-10-CM | POA: Diagnosis not present

## 2020-07-29 DIAGNOSIS — Z86010 Personal history of colonic polyps: Secondary | ICD-10-CM | POA: Diagnosis not present

## 2020-08-02 DIAGNOSIS — B029 Zoster without complications: Secondary | ICD-10-CM | POA: Diagnosis not present

## 2020-08-02 DIAGNOSIS — L821 Other seborrheic keratosis: Secondary | ICD-10-CM | POA: Diagnosis not present

## 2020-08-02 DIAGNOSIS — M48062 Spinal stenosis, lumbar region with neurogenic claudication: Secondary | ICD-10-CM | POA: Diagnosis not present

## 2020-08-04 DIAGNOSIS — Z45018 Encounter for adjustment and management of other part of cardiac pacemaker: Secondary | ICD-10-CM | POA: Diagnosis not present

## 2020-08-04 DIAGNOSIS — Z45010 Encounter for checking and testing of cardiac pacemaker pulse generator [battery]: Secondary | ICD-10-CM | POA: Diagnosis not present

## 2020-08-04 DIAGNOSIS — I442 Atrioventricular block, complete: Secondary | ICD-10-CM | POA: Diagnosis not present

## 2020-08-26 DIAGNOSIS — M48062 Spinal stenosis, lumbar region with neurogenic claudication: Secondary | ICD-10-CM | POA: Diagnosis not present

## 2020-08-26 DIAGNOSIS — M5136 Other intervertebral disc degeneration, lumbar region: Secondary | ICD-10-CM | POA: Diagnosis not present

## 2020-10-07 DIAGNOSIS — Z7982 Long term (current) use of aspirin: Secondary | ICD-10-CM | POA: Diagnosis not present

## 2020-10-07 DIAGNOSIS — E782 Mixed hyperlipidemia: Secondary | ICD-10-CM | POA: Diagnosis not present

## 2020-10-07 DIAGNOSIS — Z45018 Encounter for adjustment and management of other part of cardiac pacemaker: Secondary | ICD-10-CM | POA: Diagnosis not present

## 2020-10-07 DIAGNOSIS — I10 Essential (primary) hypertension: Secondary | ICD-10-CM | POA: Diagnosis not present

## 2020-10-07 DIAGNOSIS — Z720 Tobacco use: Secondary | ICD-10-CM | POA: Diagnosis not present

## 2020-10-07 DIAGNOSIS — I2510 Atherosclerotic heart disease of native coronary artery without angina pectoris: Secondary | ICD-10-CM | POA: Diagnosis not present

## 2020-10-07 DIAGNOSIS — Z952 Presence of prosthetic heart valve: Secondary | ICD-10-CM | POA: Diagnosis not present

## 2020-10-13 DIAGNOSIS — K31A0 Gastric intestinal metaplasia, unspecified: Secondary | ICD-10-CM | POA: Diagnosis not present

## 2020-10-13 DIAGNOSIS — D122 Benign neoplasm of ascending colon: Secondary | ICD-10-CM | POA: Diagnosis not present

## 2020-10-13 DIAGNOSIS — K5730 Diverticulosis of large intestine without perforation or abscess without bleeding: Secondary | ICD-10-CM | POA: Diagnosis not present

## 2020-10-13 DIAGNOSIS — R1319 Other dysphagia: Secondary | ICD-10-CM | POA: Diagnosis not present

## 2020-10-13 DIAGNOSIS — K635 Polyp of colon: Secondary | ICD-10-CM | POA: Diagnosis not present

## 2020-10-13 DIAGNOSIS — D120 Benign neoplasm of cecum: Secondary | ICD-10-CM | POA: Diagnosis not present

## 2020-10-13 DIAGNOSIS — Z86010 Personal history of colonic polyps: Secondary | ICD-10-CM | POA: Diagnosis not present

## 2020-10-13 DIAGNOSIS — K5520 Angiodysplasia of colon without hemorrhage: Secondary | ICD-10-CM | POA: Diagnosis not present

## 2020-10-13 DIAGNOSIS — K621 Rectal polyp: Secondary | ICD-10-CM | POA: Diagnosis not present

## 2020-10-13 DIAGNOSIS — K640 First degree hemorrhoids: Secondary | ICD-10-CM | POA: Diagnosis not present

## 2020-10-13 DIAGNOSIS — K2950 Unspecified chronic gastritis without bleeding: Secondary | ICD-10-CM | POA: Diagnosis not present

## 2020-11-03 DIAGNOSIS — Z45010 Encounter for checking and testing of cardiac pacemaker pulse generator [battery]: Secondary | ICD-10-CM | POA: Diagnosis not present

## 2020-11-03 DIAGNOSIS — I442 Atrioventricular block, complete: Secondary | ICD-10-CM | POA: Diagnosis not present

## 2020-11-03 DIAGNOSIS — Z950 Presence of cardiac pacemaker: Secondary | ICD-10-CM | POA: Diagnosis not present

## 2020-11-15 DIAGNOSIS — R7303 Prediabetes: Secondary | ICD-10-CM | POA: Diagnosis not present

## 2020-11-15 DIAGNOSIS — D539 Nutritional anemia, unspecified: Secondary | ICD-10-CM | POA: Diagnosis not present

## 2020-11-15 DIAGNOSIS — E785 Hyperlipidemia, unspecified: Secondary | ICD-10-CM | POA: Diagnosis not present

## 2020-11-23 DIAGNOSIS — J449 Chronic obstructive pulmonary disease, unspecified: Secondary | ICD-10-CM | POA: Diagnosis not present

## 2020-11-23 DIAGNOSIS — R7303 Prediabetes: Secondary | ICD-10-CM | POA: Diagnosis not present

## 2020-11-23 DIAGNOSIS — I10 Essential (primary) hypertension: Secondary | ICD-10-CM | POA: Diagnosis not present

## 2020-11-23 DIAGNOSIS — M109 Gout, unspecified: Secondary | ICD-10-CM | POA: Diagnosis not present

## 2020-11-23 DIAGNOSIS — Z6833 Body mass index (BMI) 33.0-33.9, adult: Secondary | ICD-10-CM | POA: Diagnosis not present

## 2020-11-23 DIAGNOSIS — E785 Hyperlipidemia, unspecified: Secondary | ICD-10-CM | POA: Diagnosis not present

## 2020-11-23 DIAGNOSIS — J309 Allergic rhinitis, unspecified: Secondary | ICD-10-CM | POA: Diagnosis not present

## 2020-11-23 DIAGNOSIS — N189 Chronic kidney disease, unspecified: Secondary | ICD-10-CM | POA: Diagnosis not present

## 2020-11-23 DIAGNOSIS — I350 Nonrheumatic aortic (valve) stenosis: Secondary | ICD-10-CM | POA: Diagnosis not present

## 2020-11-23 DIAGNOSIS — D539 Nutritional anemia, unspecified: Secondary | ICD-10-CM | POA: Diagnosis not present

## 2020-12-02 DIAGNOSIS — M48062 Spinal stenosis, lumbar region with neurogenic claudication: Secondary | ICD-10-CM | POA: Diagnosis not present

## 2020-12-06 DIAGNOSIS — I25118 Atherosclerotic heart disease of native coronary artery with other forms of angina pectoris: Secondary | ICD-10-CM | POA: Diagnosis not present

## 2020-12-06 DIAGNOSIS — Z45018 Encounter for adjustment and management of other part of cardiac pacemaker: Secondary | ICD-10-CM | POA: Diagnosis not present

## 2020-12-06 DIAGNOSIS — I350 Nonrheumatic aortic (valve) stenosis: Secondary | ICD-10-CM | POA: Diagnosis not present

## 2020-12-06 DIAGNOSIS — E782 Mixed hyperlipidemia: Secondary | ICD-10-CM | POA: Diagnosis not present

## 2020-12-06 DIAGNOSIS — I10 Essential (primary) hypertension: Secondary | ICD-10-CM | POA: Diagnosis not present

## 2020-12-06 DIAGNOSIS — I442 Atrioventricular block, complete: Secondary | ICD-10-CM | POA: Diagnosis not present

## 2020-12-06 DIAGNOSIS — Z952 Presence of prosthetic heart valve: Secondary | ICD-10-CM | POA: Diagnosis not present

## 2020-12-07 DIAGNOSIS — I447 Left bundle-branch block, unspecified: Secondary | ICD-10-CM | POA: Diagnosis not present

## 2020-12-10 DIAGNOSIS — D72829 Elevated white blood cell count, unspecified: Secondary | ICD-10-CM | POA: Diagnosis not present

## 2020-12-14 DIAGNOSIS — I517 Cardiomegaly: Secondary | ICD-10-CM | POA: Diagnosis not present

## 2020-12-14 DIAGNOSIS — J9811 Atelectasis: Secondary | ICD-10-CM | POA: Diagnosis not present

## 2020-12-14 DIAGNOSIS — D72829 Elevated white blood cell count, unspecified: Secondary | ICD-10-CM | POA: Diagnosis not present

## 2020-12-27 DIAGNOSIS — D72829 Elevated white blood cell count, unspecified: Secondary | ICD-10-CM | POA: Diagnosis not present

## 2020-12-30 DIAGNOSIS — M5136 Other intervertebral disc degeneration, lumbar region: Secondary | ICD-10-CM | POA: Diagnosis not present

## 2020-12-30 DIAGNOSIS — M4316 Spondylolisthesis, lumbar region: Secondary | ICD-10-CM | POA: Diagnosis not present

## 2020-12-30 DIAGNOSIS — M48061 Spinal stenosis, lumbar region without neurogenic claudication: Secondary | ICD-10-CM | POA: Diagnosis not present

## 2020-12-30 DIAGNOSIS — M47816 Spondylosis without myelopathy or radiculopathy, lumbar region: Secondary | ICD-10-CM | POA: Diagnosis not present

## 2021-01-03 DIAGNOSIS — Z952 Presence of prosthetic heart valve: Secondary | ICD-10-CM | POA: Diagnosis not present

## 2021-01-03 DIAGNOSIS — Z45018 Encounter for adjustment and management of other part of cardiac pacemaker: Secondary | ICD-10-CM | POA: Diagnosis not present

## 2021-01-03 DIAGNOSIS — I2510 Atherosclerotic heart disease of native coronary artery without angina pectoris: Secondary | ICD-10-CM | POA: Diagnosis not present

## 2021-01-21 ENCOUNTER — Ambulatory Visit (INDEPENDENT_AMBULATORY_CARE_PROVIDER_SITE_OTHER): Payer: PPO

## 2021-01-21 ENCOUNTER — Ambulatory Visit: Payer: PPO | Admitting: Podiatry

## 2021-01-21 ENCOUNTER — Encounter: Payer: Self-pay | Admitting: Podiatry

## 2021-01-21 ENCOUNTER — Other Ambulatory Visit: Payer: Self-pay

## 2021-01-21 DIAGNOSIS — M21619 Bunion of unspecified foot: Secondary | ICD-10-CM | POA: Diagnosis not present

## 2021-01-21 DIAGNOSIS — M21622 Bunionette of left foot: Secondary | ICD-10-CM

## 2021-01-21 DIAGNOSIS — M21612 Bunion of left foot: Secondary | ICD-10-CM | POA: Diagnosis not present

## 2021-01-24 NOTE — Progress Notes (Signed)
Subjective:   Patient ID: Dylan Green, male   DOB: 75 y.o.   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  Patient wants surgery and I allowed him to read consent form going over alternative treatments complications and patient signed consent form after extensive review understanding recovery can take upwards of 6 months for complete recovery.  Patient wants surgery all questions answered scheduled outpatient surgery encouraged to call any remaining questions  Precautionary x-ray today indicated no signs of crease pathology or fracture

## 2021-01-27 DIAGNOSIS — S81812A Laceration without foreign body, left lower leg, initial encounter: Secondary | ICD-10-CM | POA: Diagnosis not present

## 2021-02-02 DIAGNOSIS — Z950 Presence of cardiac pacemaker: Secondary | ICD-10-CM | POA: Diagnosis not present

## 2021-02-02 DIAGNOSIS — I442 Atrioventricular block, complete: Secondary | ICD-10-CM | POA: Diagnosis not present

## 2021-02-03 DIAGNOSIS — Z45018 Encounter for adjustment and management of other part of cardiac pacemaker: Secondary | ICD-10-CM | POA: Diagnosis not present

## 2021-02-03 DIAGNOSIS — I442 Atrioventricular block, complete: Secondary | ICD-10-CM | POA: Diagnosis not present

## 2021-02-03 DIAGNOSIS — L400 Psoriasis vulgaris: Secondary | ICD-10-CM | POA: Diagnosis not present

## 2021-02-03 DIAGNOSIS — L728 Other follicular cysts of the skin and subcutaneous tissue: Secondary | ICD-10-CM | POA: Diagnosis not present

## 2021-02-03 DIAGNOSIS — L821 Other seborrheic keratosis: Secondary | ICD-10-CM | POA: Diagnosis not present

## 2021-02-05 DIAGNOSIS — L0390 Cellulitis, unspecified: Secondary | ICD-10-CM | POA: Diagnosis not present

## 2021-02-05 DIAGNOSIS — S81812A Laceration without foreign body, left lower leg, initial encounter: Secondary | ICD-10-CM | POA: Diagnosis not present

## 2021-02-07 DIAGNOSIS — S81812D Laceration without foreign body, left lower leg, subsequent encounter: Secondary | ICD-10-CM | POA: Diagnosis not present

## 2021-02-22 DIAGNOSIS — M109 Gout, unspecified: Secondary | ICD-10-CM | POA: Diagnosis not present

## 2021-02-22 DIAGNOSIS — R600 Localized edema: Secondary | ICD-10-CM | POA: Diagnosis not present

## 2021-03-02 ENCOUNTER — Telehealth: Payer: Self-pay | Admitting: Urology

## 2021-03-02 NOTE — Telephone Encounter (Signed)
DOS - 03/29/21  METATARSAL HEAD RES 5TH LEFT --- [place] EFFECTIVE DATE - 06/13/19   RECEIVED FAX FROM [facility] STATING THAT FOR CPT CODE 59741 HAS BEEN APPROVED, AUTH # [id number], GOOD FROM 03/29/21 - 06/27/2021

## 2021-03-07 DIAGNOSIS — M48062 Spinal stenosis, lumbar region with neurogenic claudication: Secondary | ICD-10-CM | POA: Diagnosis not present

## 2021-03-09 DIAGNOSIS — R7303 Prediabetes: Secondary | ICD-10-CM | POA: Diagnosis not present

## 2021-03-09 DIAGNOSIS — D539 Nutritional anemia, unspecified: Secondary | ICD-10-CM | POA: Diagnosis not present

## 2021-03-09 DIAGNOSIS — I10 Essential (primary) hypertension: Secondary | ICD-10-CM | POA: Diagnosis not present

## 2021-03-09 DIAGNOSIS — E785 Hyperlipidemia, unspecified: Secondary | ICD-10-CM | POA: Diagnosis not present

## 2021-03-14 DIAGNOSIS — D539 Nutritional anemia, unspecified: Secondary | ICD-10-CM | POA: Diagnosis not present

## 2021-03-14 DIAGNOSIS — Z720 Tobacco use: Secondary | ICD-10-CM | POA: Diagnosis not present

## 2021-03-14 DIAGNOSIS — E785 Hyperlipidemia, unspecified: Secondary | ICD-10-CM | POA: Diagnosis not present

## 2021-03-14 DIAGNOSIS — Z23 Encounter for immunization: Secondary | ICD-10-CM | POA: Diagnosis not present

## 2021-03-14 DIAGNOSIS — R7303 Prediabetes: Secondary | ICD-10-CM | POA: Diagnosis not present

## 2021-03-14 DIAGNOSIS — J449 Chronic obstructive pulmonary disease, unspecified: Secondary | ICD-10-CM | POA: Diagnosis not present

## 2021-03-14 DIAGNOSIS — Z6833 Body mass index (BMI) 33.0-33.9, adult: Secondary | ICD-10-CM | POA: Diagnosis not present

## 2021-03-14 DIAGNOSIS — M109 Gout, unspecified: Secondary | ICD-10-CM | POA: Diagnosis not present

## 2021-03-14 DIAGNOSIS — I10 Essential (primary) hypertension: Secondary | ICD-10-CM | POA: Diagnosis not present

## 2021-03-14 DIAGNOSIS — I350 Nonrheumatic aortic (valve) stenosis: Secondary | ICD-10-CM | POA: Diagnosis not present

## 2021-03-14 DIAGNOSIS — N189 Chronic kidney disease, unspecified: Secondary | ICD-10-CM | POA: Diagnosis not present

## 2021-03-21 DIAGNOSIS — I455 Other specified heart block: Secondary | ICD-10-CM | POA: Diagnosis not present

## 2021-03-21 DIAGNOSIS — Z952 Presence of prosthetic heart valve: Secondary | ICD-10-CM | POA: Diagnosis not present

## 2021-03-21 DIAGNOSIS — Z950 Presence of cardiac pacemaker: Secondary | ICD-10-CM | POA: Diagnosis not present

## 2021-03-21 DIAGNOSIS — I447 Left bundle-branch block, unspecified: Secondary | ICD-10-CM | POA: Diagnosis not present

## 2021-03-22 DIAGNOSIS — I447 Left bundle-branch block, unspecified: Secondary | ICD-10-CM | POA: Diagnosis not present

## 2021-03-22 DIAGNOSIS — I4430 Unspecified atrioventricular block: Secondary | ICD-10-CM | POA: Diagnosis not present

## 2021-03-22 DIAGNOSIS — S81811A Laceration without foreign body, right lower leg, initial encounter: Secondary | ICD-10-CM | POA: Diagnosis not present

## 2021-03-22 DIAGNOSIS — I498 Other specified cardiac arrhythmias: Secondary | ICD-10-CM | POA: Diagnosis not present

## 2021-03-23 DIAGNOSIS — M48062 Spinal stenosis, lumbar region with neurogenic claudication: Secondary | ICD-10-CM | POA: Diagnosis not present

## 2021-03-23 DIAGNOSIS — R030 Elevated blood-pressure reading, without diagnosis of hypertension: Secondary | ICD-10-CM | POA: Diagnosis not present

## 2021-03-23 DIAGNOSIS — M47816 Spondylosis without myelopathy or radiculopathy, lumbar region: Secondary | ICD-10-CM | POA: Diagnosis not present

## 2021-03-23 DIAGNOSIS — M5136 Other intervertebral disc degeneration, lumbar region: Secondary | ICD-10-CM | POA: Diagnosis not present

## 2021-03-29 DIAGNOSIS — S81811A Laceration without foreign body, right lower leg, initial encounter: Secondary | ICD-10-CM | POA: Diagnosis not present

## 2021-03-29 DIAGNOSIS — M79604 Pain in right leg: Secondary | ICD-10-CM | POA: Diagnosis not present

## 2021-04-01 DIAGNOSIS — Z4802 Encounter for removal of sutures: Secondary | ICD-10-CM | POA: Diagnosis not present

## 2021-04-01 DIAGNOSIS — S81811A Laceration without foreign body, right lower leg, initial encounter: Secondary | ICD-10-CM | POA: Diagnosis not present

## 2021-04-04 ENCOUNTER — Encounter: Payer: PPO | Admitting: Podiatry

## 2021-04-04 MED ORDER — HYDROCODONE-ACETAMINOPHEN 10-325 MG PO TABS: 1.0000 | ORAL_TABLET | Freq: Three times a day (TID) | ORAL | 0 refills | Status: AC | PRN

## 2021-04-04 NOTE — Addendum Note (Signed)
Addended by: Wallene Huh on: 04/04/2021 05:18 PM   Modules accepted: Orders

## 2021-04-05 ENCOUNTER — Encounter: Payer: Self-pay | Admitting: Podiatry

## 2021-04-05 DIAGNOSIS — M21622 Bunionette of left foot: Secondary | ICD-10-CM | POA: Diagnosis not present

## 2021-04-05 DIAGNOSIS — M2012 Hallux valgus (acquired), left foot: Secondary | ICD-10-CM | POA: Diagnosis not present

## 2021-04-07 ENCOUNTER — Other Ambulatory Visit: Payer: Self-pay | Admitting: Podiatry

## 2021-04-07 NOTE — Telephone Encounter (Signed)
Please advise 

## 2021-04-08 DIAGNOSIS — S81801A Unspecified open wound, right lower leg, initial encounter: Secondary | ICD-10-CM | POA: Diagnosis not present

## 2021-04-08 NOTE — Telephone Encounter (Signed)
Should not need at this time

## 2021-04-11 ENCOUNTER — Other Ambulatory Visit: Payer: Self-pay

## 2021-04-11 ENCOUNTER — Encounter: Payer: Self-pay | Admitting: Podiatry

## 2021-04-11 ENCOUNTER — Ambulatory Visit (INDEPENDENT_AMBULATORY_CARE_PROVIDER_SITE_OTHER): Payer: PPO

## 2021-04-11 ENCOUNTER — Ambulatory Visit (INDEPENDENT_AMBULATORY_CARE_PROVIDER_SITE_OTHER): Payer: PPO | Admitting: Podiatry

## 2021-04-11 DIAGNOSIS — M21622 Bunionette of left foot: Secondary | ICD-10-CM

## 2021-04-11 NOTE — Progress Notes (Signed)
Subjective:   Patient ID: Dylan Green, male   DOB: 75 y.o.   MRN: [id number]   HPI Patient presents stating he is doing very well pleased with surgery   ROS      Objective:  Physical Exam  Neuro vascular status intact negative Bevelyn Buckles' sign noted wound edges well coapted fifth metatarsal left good alignment noted     Assessment:  Doing well post fifth metatarsal head resection left with stitches intact     Plan:  H&P x-ray reviewed sterile dressing reapplied continue elevation immobilization dispensed surgical shoe reappoint 2 weeks suture removal earlier if needed  X-rays indicate satisfactory resection head of fifth metatarsal left good alignment noted

## 2021-04-14 DIAGNOSIS — S81801A Unspecified open wound, right lower leg, initial encounter: Secondary | ICD-10-CM | POA: Diagnosis not present

## 2021-04-25 ENCOUNTER — Other Ambulatory Visit: Payer: Self-pay

## 2021-04-25 ENCOUNTER — Encounter: Payer: Self-pay | Admitting: Podiatry

## 2021-04-25 ENCOUNTER — Ambulatory Visit: Payer: PPO | Admitting: Podiatry

## 2021-04-25 DIAGNOSIS — M21622 Bunionette of left foot: Secondary | ICD-10-CM

## 2021-04-25 NOTE — Progress Notes (Signed)
Subjective:   Patient ID: Dylan Green, male   DOB: 75 y.o.   MRN: [id number]   HPI Patient presents stating doing very well pleased with surgery only minimal discomfort   ROS      Objective:  Physical Exam  Neurovascular status intact negative Bevelyn Buckles' sign was noted wound edges well coapted left with no plantar pain noted currently     Assessment:  Doing well post fifth metatarsal head resection left stitches intact     Plan:  Stitches removed wound edges coapted well applied bandage after stitches were removed and instructed on continued elevation compression with gradual increase in activity with swelling which is normal during the healing process and will be seen back as needed

## 2021-04-28 DIAGNOSIS — S81801A Unspecified open wound, right lower leg, initial encounter: Secondary | ICD-10-CM | POA: Diagnosis not present

## 2021-05-04 DIAGNOSIS — Z45010 Encounter for checking and testing of cardiac pacemaker pulse generator [battery]: Secondary | ICD-10-CM | POA: Diagnosis not present

## 2021-05-04 DIAGNOSIS — I442 Atrioventricular block, complete: Secondary | ICD-10-CM | POA: Diagnosis not present

## 2021-05-04 DIAGNOSIS — Z45018 Encounter for adjustment and management of other part of cardiac pacemaker: Secondary | ICD-10-CM | POA: Diagnosis not present

## 2021-05-09 DIAGNOSIS — B079 Viral wart, unspecified: Secondary | ICD-10-CM | POA: Diagnosis not present

## 2021-05-09 DIAGNOSIS — L820 Inflamed seborrheic keratosis: Secondary | ICD-10-CM | POA: Diagnosis not present

## 2021-05-09 DIAGNOSIS — L578 Other skin changes due to chronic exposure to nonionizing radiation: Secondary | ICD-10-CM | POA: Diagnosis not present

## 2021-05-12 DIAGNOSIS — S81801A Unspecified open wound, right lower leg, initial encounter: Secondary | ICD-10-CM | POA: Diagnosis not present

## 2021-06-02 DIAGNOSIS — M48062 Spinal stenosis, lumbar region with neurogenic claudication: Secondary | ICD-10-CM | POA: Diagnosis not present

## 2021-06-09 DIAGNOSIS — Z950 Presence of cardiac pacemaker: Secondary | ICD-10-CM | POA: Diagnosis not present

## 2021-06-09 DIAGNOSIS — Z720 Tobacco use: Secondary | ICD-10-CM | POA: Diagnosis not present

## 2021-06-09 DIAGNOSIS — Z7982 Long term (current) use of aspirin: Secondary | ICD-10-CM | POA: Diagnosis not present

## 2021-06-09 DIAGNOSIS — I2510 Atherosclerotic heart disease of native coronary artery without angina pectoris: Secondary | ICD-10-CM | POA: Diagnosis not present

## 2021-06-09 DIAGNOSIS — E782 Mixed hyperlipidemia: Secondary | ICD-10-CM | POA: Diagnosis not present

## 2021-06-09 DIAGNOSIS — Z952 Presence of prosthetic heart valve: Secondary | ICD-10-CM | POA: Diagnosis not present

## 2021-06-09 DIAGNOSIS — I119 Hypertensive heart disease without heart failure: Secondary | ICD-10-CM | POA: Diagnosis not present

## 2021-06-10 DIAGNOSIS — I10 Essential (primary) hypertension: Secondary | ICD-10-CM | POA: Diagnosis not present

## 2021-06-10 DIAGNOSIS — I2510 Atherosclerotic heart disease of native coronary artery without angina pectoris: Secondary | ICD-10-CM | POA: Diagnosis not present

## 2021-06-21 DIAGNOSIS — J019 Acute sinusitis, unspecified: Secondary | ICD-10-CM | POA: Diagnosis not present

## 2021-06-21 DIAGNOSIS — R0981 Nasal congestion: Secondary | ICD-10-CM | POA: Diagnosis not present

## 2021-06-22 DIAGNOSIS — S81801A Unspecified open wound, right lower leg, initial encounter: Secondary | ICD-10-CM | POA: Diagnosis not present

## 2021-06-24 DIAGNOSIS — M159 Polyosteoarthritis, unspecified: Secondary | ICD-10-CM | POA: Diagnosis not present

## 2021-06-24 DIAGNOSIS — G2581 Restless legs syndrome: Secondary | ICD-10-CM | POA: Diagnosis not present

## 2021-06-24 DIAGNOSIS — K279 Peptic ulcer, site unspecified, unspecified as acute or chronic, without hemorrhage or perforation: Secondary | ICD-10-CM | POA: Diagnosis not present

## 2021-06-24 DIAGNOSIS — N400 Enlarged prostate without lower urinary tract symptoms: Secondary | ICD-10-CM | POA: Diagnosis not present

## 2021-06-24 DIAGNOSIS — Z8679 Personal history of other diseases of the circulatory system: Secondary | ICD-10-CM | POA: Diagnosis not present

## 2021-06-24 DIAGNOSIS — I495 Sick sinus syndrome: Secondary | ICD-10-CM | POA: Diagnosis not present

## 2021-06-24 DIAGNOSIS — E785 Hyperlipidemia, unspecified: Secondary | ICD-10-CM | POA: Diagnosis not present

## 2021-06-24 DIAGNOSIS — Z950 Presence of cardiac pacemaker: Secondary | ICD-10-CM | POA: Diagnosis not present

## 2021-06-24 DIAGNOSIS — I10 Essential (primary) hypertension: Secondary | ICD-10-CM | POA: Diagnosis not present

## 2021-06-24 DIAGNOSIS — Z952 Presence of prosthetic heart valve: Secondary | ICD-10-CM | POA: Diagnosis not present

## 2021-06-27 DIAGNOSIS — M48062 Spinal stenosis, lumbar region with neurogenic claudication: Secondary | ICD-10-CM | POA: Diagnosis not present

## 2021-07-11 DIAGNOSIS — I97190 Other postprocedural cardiac functional disturbances following cardiac surgery: Secondary | ICD-10-CM | POA: Diagnosis not present

## 2021-07-11 DIAGNOSIS — I4892 Unspecified atrial flutter: Secondary | ICD-10-CM | POA: Diagnosis not present

## 2021-07-11 DIAGNOSIS — I442 Atrioventricular block, complete: Secondary | ICD-10-CM | POA: Diagnosis not present

## 2021-07-11 DIAGNOSIS — I2510 Atherosclerotic heart disease of native coronary artery without angina pectoris: Secondary | ICD-10-CM | POA: Diagnosis not present

## 2021-07-11 DIAGNOSIS — Z950 Presence of cardiac pacemaker: Secondary | ICD-10-CM | POA: Diagnosis not present

## 2021-07-12 DIAGNOSIS — I447 Left bundle-branch block, unspecified: Secondary | ICD-10-CM | POA: Diagnosis not present

## 2021-07-12 DIAGNOSIS — I2510 Atherosclerotic heart disease of native coronary artery without angina pectoris: Secondary | ICD-10-CM | POA: Diagnosis not present

## 2021-07-14 DIAGNOSIS — D539 Nutritional anemia, unspecified: Secondary | ICD-10-CM | POA: Diagnosis not present

## 2021-07-14 DIAGNOSIS — E785 Hyperlipidemia, unspecified: Secondary | ICD-10-CM | POA: Diagnosis not present

## 2021-07-14 DIAGNOSIS — I10 Essential (primary) hypertension: Secondary | ICD-10-CM | POA: Diagnosis not present

## 2021-07-19 DIAGNOSIS — N189 Chronic kidney disease, unspecified: Secondary | ICD-10-CM | POA: Diagnosis not present

## 2021-07-19 DIAGNOSIS — J329 Chronic sinusitis, unspecified: Secondary | ICD-10-CM | POA: Diagnosis not present

## 2021-07-19 DIAGNOSIS — E785 Hyperlipidemia, unspecified: Secondary | ICD-10-CM | POA: Diagnosis not present

## 2021-07-19 DIAGNOSIS — R7303 Prediabetes: Secondary | ICD-10-CM | POA: Diagnosis not present

## 2021-07-19 DIAGNOSIS — D539 Nutritional anemia, unspecified: Secondary | ICD-10-CM | POA: Diagnosis not present

## 2021-07-19 DIAGNOSIS — Z720 Tobacco use: Secondary | ICD-10-CM | POA: Diagnosis not present

## 2021-07-19 DIAGNOSIS — J449 Chronic obstructive pulmonary disease, unspecified: Secondary | ICD-10-CM | POA: Diagnosis not present

## 2021-07-19 DIAGNOSIS — I10 Essential (primary) hypertension: Secondary | ICD-10-CM | POA: Diagnosis not present

## 2021-07-19 DIAGNOSIS — I350 Nonrheumatic aortic (valve) stenosis: Secondary | ICD-10-CM | POA: Diagnosis not present

## 2021-07-19 DIAGNOSIS — M109 Gout, unspecified: Secondary | ICD-10-CM | POA: Diagnosis not present

## 2021-07-19 DIAGNOSIS — Z6832 Body mass index (BMI) 32.0-32.9, adult: Secondary | ICD-10-CM | POA: Diagnosis not present

## 2021-08-03 DIAGNOSIS — I442 Atrioventricular block, complete: Secondary | ICD-10-CM | POA: Diagnosis not present

## 2021-08-03 DIAGNOSIS — Z45018 Encounter for adjustment and management of other part of cardiac pacemaker: Secondary | ICD-10-CM | POA: Diagnosis not present

## 2021-08-03 DIAGNOSIS — Z950 Presence of cardiac pacemaker: Secondary | ICD-10-CM | POA: Diagnosis not present

## 2021-08-03 DIAGNOSIS — I491 Atrial premature depolarization: Secondary | ICD-10-CM | POA: Diagnosis not present

## 2021-09-01 DIAGNOSIS — M48062 Spinal stenosis, lumbar region with neurogenic claudication: Secondary | ICD-10-CM | POA: Diagnosis not present

## 2021-09-01 DIAGNOSIS — M47816 Spondylosis without myelopathy or radiculopathy, lumbar region: Secondary | ICD-10-CM | POA: Diagnosis not present

## 2021-09-09 DIAGNOSIS — E785 Hyperlipidemia, unspecified: Secondary | ICD-10-CM | POA: Diagnosis not present

## 2021-09-09 DIAGNOSIS — J449 Chronic obstructive pulmonary disease, unspecified: Secondary | ICD-10-CM | POA: Diagnosis not present

## 2021-09-09 DIAGNOSIS — I10 Essential (primary) hypertension: Secondary | ICD-10-CM | POA: Diagnosis not present

## 2021-11-02 DIAGNOSIS — Z45018 Encounter for adjustment and management of other part of cardiac pacemaker: Secondary | ICD-10-CM | POA: Diagnosis not present

## 2021-11-02 DIAGNOSIS — I442 Atrioventricular block, complete: Secondary | ICD-10-CM | POA: Diagnosis not present

## 2021-11-03 DIAGNOSIS — Z45018 Encounter for adjustment and management of other part of cardiac pacemaker: Secondary | ICD-10-CM | POA: Diagnosis not present

## 2021-11-08 DIAGNOSIS — D539 Nutritional anemia, unspecified: Secondary | ICD-10-CM | POA: Diagnosis not present

## 2021-11-08 DIAGNOSIS — E785 Hyperlipidemia, unspecified: Secondary | ICD-10-CM | POA: Diagnosis not present

## 2021-11-08 DIAGNOSIS — R7303 Prediabetes: Secondary | ICD-10-CM | POA: Diagnosis not present

## 2021-11-14 DIAGNOSIS — G2581 Restless legs syndrome: Secondary | ICD-10-CM | POA: Diagnosis not present

## 2021-11-14 DIAGNOSIS — I10 Essential (primary) hypertension: Secondary | ICD-10-CM | POA: Diagnosis not present

## 2021-11-14 DIAGNOSIS — J449 Chronic obstructive pulmonary disease, unspecified: Secondary | ICD-10-CM | POA: Diagnosis not present

## 2021-11-14 DIAGNOSIS — E785 Hyperlipidemia, unspecified: Secondary | ICD-10-CM | POA: Diagnosis not present

## 2021-11-14 DIAGNOSIS — J329 Chronic sinusitis, unspecified: Secondary | ICD-10-CM | POA: Diagnosis not present

## 2021-11-14 DIAGNOSIS — M109 Gout, unspecified: Secondary | ICD-10-CM | POA: Diagnosis not present

## 2021-11-14 DIAGNOSIS — R7303 Prediabetes: Secondary | ICD-10-CM | POA: Diagnosis not present

## 2021-11-14 DIAGNOSIS — Z720 Tobacco use: Secondary | ICD-10-CM | POA: Diagnosis not present

## 2021-11-14 DIAGNOSIS — N1831 Chronic kidney disease, stage 3a: Secondary | ICD-10-CM | POA: Diagnosis not present

## 2021-11-14 DIAGNOSIS — I350 Nonrheumatic aortic (valve) stenosis: Secondary | ICD-10-CM | POA: Diagnosis not present

## 2021-11-14 DIAGNOSIS — D539 Nutritional anemia, unspecified: Secondary | ICD-10-CM | POA: Diagnosis not present

## 2021-12-05 DIAGNOSIS — M47816 Spondylosis without myelopathy or radiculopathy, lumbar region: Secondary | ICD-10-CM | POA: Diagnosis not present

## 2021-12-08 DIAGNOSIS — Z720 Tobacco use: Secondary | ICD-10-CM | POA: Diagnosis not present

## 2021-12-08 DIAGNOSIS — E782 Mixed hyperlipidemia: Secondary | ICD-10-CM | POA: Diagnosis not present

## 2021-12-08 DIAGNOSIS — Z7982 Long term (current) use of aspirin: Secondary | ICD-10-CM | POA: Diagnosis not present

## 2021-12-08 DIAGNOSIS — Z952 Presence of prosthetic heart valve: Secondary | ICD-10-CM | POA: Diagnosis not present

## 2021-12-08 DIAGNOSIS — Z45018 Encounter for adjustment and management of other part of cardiac pacemaker: Secondary | ICD-10-CM | POA: Diagnosis not present

## 2021-12-08 DIAGNOSIS — I2510 Atherosclerotic heart disease of native coronary artery without angina pectoris: Secondary | ICD-10-CM | POA: Diagnosis not present

## 2021-12-08 DIAGNOSIS — I119 Hypertensive heart disease without heart failure: Secondary | ICD-10-CM | POA: Diagnosis not present

## 2022-01-09 DIAGNOSIS — Z79899 Other long term (current) drug therapy: Secondary | ICD-10-CM | POA: Diagnosis not present

## 2022-01-09 DIAGNOSIS — I447 Left bundle-branch block, unspecified: Secondary | ICD-10-CM | POA: Diagnosis not present

## 2022-01-09 DIAGNOSIS — Z952 Presence of prosthetic heart valve: Secondary | ICD-10-CM | POA: Diagnosis not present

## 2022-01-09 DIAGNOSIS — I442 Atrioventricular block, complete: Secondary | ICD-10-CM | POA: Diagnosis not present

## 2022-01-09 DIAGNOSIS — Z950 Presence of cardiac pacemaker: Secondary | ICD-10-CM | POA: Diagnosis not present

## 2022-01-09 DIAGNOSIS — I480 Paroxysmal atrial fibrillation: Secondary | ICD-10-CM | POA: Diagnosis not present

## 2022-01-14 DIAGNOSIS — L578 Other skin changes due to chronic exposure to nonionizing radiation: Secondary | ICD-10-CM | POA: Diagnosis not present

## 2022-01-14 DIAGNOSIS — L820 Inflamed seborrheic keratosis: Secondary | ICD-10-CM | POA: Diagnosis not present

## 2022-01-14 DIAGNOSIS — L814 Other melanin hyperpigmentation: Secondary | ICD-10-CM | POA: Diagnosis not present

## 2022-01-14 DIAGNOSIS — D225 Melanocytic nevi of trunk: Secondary | ICD-10-CM | POA: Diagnosis not present

## 2022-01-30 DIAGNOSIS — Z952 Presence of prosthetic heart valve: Secondary | ICD-10-CM | POA: Diagnosis not present

## 2022-01-30 DIAGNOSIS — Z45018 Encounter for adjustment and management of other part of cardiac pacemaker: Secondary | ICD-10-CM | POA: Diagnosis not present

## 2022-02-01 DIAGNOSIS — Z45018 Encounter for adjustment and management of other part of cardiac pacemaker: Secondary | ICD-10-CM | POA: Diagnosis not present

## 2022-02-01 DIAGNOSIS — I442 Atrioventricular block, complete: Secondary | ICD-10-CM | POA: Diagnosis not present

## 2022-03-08 DIAGNOSIS — M48062 Spinal stenosis, lumbar region with neurogenic claudication: Secondary | ICD-10-CM | POA: Diagnosis not present

## 2022-03-09 DIAGNOSIS — E785 Hyperlipidemia, unspecified: Secondary | ICD-10-CM | POA: Diagnosis not present

## 2022-03-09 DIAGNOSIS — D539 Nutritional anemia, unspecified: Secondary | ICD-10-CM | POA: Diagnosis not present

## 2022-03-09 DIAGNOSIS — Z45018 Encounter for adjustment and management of other part of cardiac pacemaker: Secondary | ICD-10-CM | POA: Diagnosis not present

## 2022-03-09 DIAGNOSIS — I442 Atrioventricular block, complete: Secondary | ICD-10-CM | POA: Diagnosis not present

## 2022-03-17 DIAGNOSIS — Z23 Encounter for immunization: Secondary | ICD-10-CM | POA: Diagnosis not present

## 2022-03-17 DIAGNOSIS — N1831 Chronic kidney disease, stage 3a: Secondary | ICD-10-CM | POA: Diagnosis not present

## 2022-03-17 DIAGNOSIS — J449 Chronic obstructive pulmonary disease, unspecified: Secondary | ICD-10-CM | POA: Diagnosis not present

## 2022-03-17 DIAGNOSIS — Z720 Tobacco use: Secondary | ICD-10-CM | POA: Diagnosis not present

## 2022-03-17 DIAGNOSIS — I10 Essential (primary) hypertension: Secondary | ICD-10-CM | POA: Diagnosis not present

## 2022-03-17 DIAGNOSIS — E875 Hyperkalemia: Secondary | ICD-10-CM | POA: Diagnosis not present

## 2022-03-17 DIAGNOSIS — J329 Chronic sinusitis, unspecified: Secondary | ICD-10-CM | POA: Diagnosis not present

## 2022-03-17 DIAGNOSIS — Z125 Encounter for screening for malignant neoplasm of prostate: Secondary | ICD-10-CM | POA: Diagnosis not present

## 2022-03-17 DIAGNOSIS — R7303 Prediabetes: Secondary | ICD-10-CM | POA: Diagnosis not present

## 2022-03-17 DIAGNOSIS — Z139 Encounter for screening, unspecified: Secondary | ICD-10-CM | POA: Diagnosis not present

## 2022-03-17 DIAGNOSIS — D539 Nutritional anemia, unspecified: Secondary | ICD-10-CM | POA: Diagnosis not present

## 2022-03-17 DIAGNOSIS — I350 Nonrheumatic aortic (valve) stenosis: Secondary | ICD-10-CM | POA: Diagnosis not present

## 2022-03-17 DIAGNOSIS — E785 Hyperlipidemia, unspecified: Secondary | ICD-10-CM | POA: Diagnosis not present

## 2022-03-17 DIAGNOSIS — Z9181 History of falling: Secondary | ICD-10-CM | POA: Diagnosis not present

## 2022-04-03 DIAGNOSIS — E875 Hyperkalemia: Secondary | ICD-10-CM | POA: Diagnosis not present

## 2022-05-26 DIAGNOSIS — H47012 Ischemic optic neuropathy, left eye: Secondary | ICD-10-CM | POA: Diagnosis not present

## 2022-05-26 DIAGNOSIS — Z961 Presence of intraocular lens: Secondary | ICD-10-CM | POA: Diagnosis not present

## 2022-06-09 DIAGNOSIS — Z45018 Encounter for adjustment and management of other part of cardiac pacemaker: Secondary | ICD-10-CM | POA: Diagnosis not present

## 2022-06-13 DIAGNOSIS — M5416 Radiculopathy, lumbar region: Secondary | ICD-10-CM | POA: Diagnosis not present

## 2022-07-10 DIAGNOSIS — D539 Nutritional anemia, unspecified: Secondary | ICD-10-CM | POA: Diagnosis not present

## 2022-07-10 DIAGNOSIS — E785 Hyperlipidemia, unspecified: Secondary | ICD-10-CM | POA: Diagnosis not present

## 2022-07-10 DIAGNOSIS — R7303 Prediabetes: Secondary | ICD-10-CM | POA: Diagnosis not present

## 2022-07-18 DIAGNOSIS — I10 Essential (primary) hypertension: Secondary | ICD-10-CM | POA: Diagnosis not present

## 2022-07-18 DIAGNOSIS — G2581 Restless legs syndrome: Secondary | ICD-10-CM | POA: Diagnosis not present

## 2022-07-18 DIAGNOSIS — I350 Nonrheumatic aortic (valve) stenosis: Secondary | ICD-10-CM | POA: Diagnosis not present

## 2022-07-18 DIAGNOSIS — D539 Nutritional anemia, unspecified: Secondary | ICD-10-CM | POA: Diagnosis not present

## 2022-07-18 DIAGNOSIS — N1831 Chronic kidney disease, stage 3a: Secondary | ICD-10-CM | POA: Diagnosis not present

## 2022-07-18 DIAGNOSIS — R7303 Prediabetes: Secondary | ICD-10-CM | POA: Diagnosis not present

## 2022-07-18 DIAGNOSIS — M109 Gout, unspecified: Secondary | ICD-10-CM | POA: Diagnosis not present

## 2022-07-18 DIAGNOSIS — E785 Hyperlipidemia, unspecified: Secondary | ICD-10-CM | POA: Diagnosis not present

## 2022-07-18 DIAGNOSIS — J329 Chronic sinusitis, unspecified: Secondary | ICD-10-CM | POA: Diagnosis not present

## 2022-07-18 DIAGNOSIS — J449 Chronic obstructive pulmonary disease, unspecified: Secondary | ICD-10-CM | POA: Diagnosis not present

## 2022-07-24 DIAGNOSIS — Z950 Presence of cardiac pacemaker: Secondary | ICD-10-CM | POA: Diagnosis not present

## 2022-07-24 DIAGNOSIS — I480 Paroxysmal atrial fibrillation: Secondary | ICD-10-CM | POA: Diagnosis not present

## 2022-07-24 DIAGNOSIS — I447 Left bundle-branch block, unspecified: Secondary | ICD-10-CM | POA: Diagnosis not present

## 2022-07-24 DIAGNOSIS — Z952 Presence of prosthetic heart valve: Secondary | ICD-10-CM | POA: Diagnosis not present

## 2022-07-24 DIAGNOSIS — I442 Atrioventricular block, complete: Secondary | ICD-10-CM | POA: Diagnosis not present

## 2022-07-26 DIAGNOSIS — L03116 Cellulitis of left lower limb: Secondary | ICD-10-CM | POA: Diagnosis not present

## 2022-07-26 DIAGNOSIS — L03115 Cellulitis of right lower limb: Secondary | ICD-10-CM | POA: Diagnosis not present

## 2022-07-26 DIAGNOSIS — M109 Gout, unspecified: Secondary | ICD-10-CM | POA: Diagnosis not present

## 2022-08-10 DIAGNOSIS — L03116 Cellulitis of left lower limb: Secondary | ICD-10-CM | POA: Diagnosis not present

## 2022-08-10 DIAGNOSIS — I83899 Varicose veins of unspecified lower extremities with other complications: Secondary | ICD-10-CM | POA: Diagnosis not present

## 2022-08-10 DIAGNOSIS — L03115 Cellulitis of right lower limb: Secondary | ICD-10-CM | POA: Diagnosis not present

## 2022-08-10 DIAGNOSIS — R600 Localized edema: Secondary | ICD-10-CM | POA: Diagnosis not present

## 2022-08-21 DIAGNOSIS — Z8679 Personal history of other diseases of the circulatory system: Secondary | ICD-10-CM | POA: Diagnosis not present

## 2022-08-21 DIAGNOSIS — Z952 Presence of prosthetic heart valve: Secondary | ICD-10-CM | POA: Diagnosis not present

## 2022-08-21 DIAGNOSIS — Z955 Presence of coronary angioplasty implant and graft: Secondary | ICD-10-CM | POA: Diagnosis not present

## 2022-08-21 DIAGNOSIS — Z950 Presence of cardiac pacemaker: Secondary | ICD-10-CM | POA: Diagnosis not present

## 2022-08-21 DIAGNOSIS — Z720 Tobacco use: Secondary | ICD-10-CM | POA: Diagnosis not present

## 2022-08-21 DIAGNOSIS — E782 Mixed hyperlipidemia: Secondary | ICD-10-CM | POA: Diagnosis not present

## 2022-08-21 DIAGNOSIS — I519 Heart disease, unspecified: Secondary | ICD-10-CM | POA: Diagnosis not present

## 2022-08-21 DIAGNOSIS — I442 Atrioventricular block, complete: Secondary | ICD-10-CM | POA: Diagnosis not present

## 2022-08-21 DIAGNOSIS — I2510 Atherosclerotic heart disease of native coronary artery without angina pectoris: Secondary | ICD-10-CM | POA: Diagnosis not present

## 2022-08-29 DIAGNOSIS — R600 Localized edema: Secondary | ICD-10-CM | POA: Diagnosis not present

## 2022-08-29 DIAGNOSIS — I83899 Varicose veins of unspecified lower extremities with other complications: Secondary | ICD-10-CM | POA: Diagnosis not present

## 2022-09-10 DIAGNOSIS — Z45010 Encounter for checking and testing of cardiac pacemaker pulse generator [battery]: Secondary | ICD-10-CM | POA: Diagnosis not present

## 2022-09-13 DIAGNOSIS — M5416 Radiculopathy, lumbar region: Secondary | ICD-10-CM | POA: Diagnosis not present

## 2022-10-11 DIAGNOSIS — I501 Left ventricular failure: Secondary | ICD-10-CM | POA: Diagnosis not present

## 2022-10-11 DIAGNOSIS — Z952 Presence of prosthetic heart valve: Secondary | ICD-10-CM | POA: Diagnosis not present

## 2022-10-11 DIAGNOSIS — I2510 Atherosclerotic heart disease of native coronary artery without angina pectoris: Secondary | ICD-10-CM | POA: Diagnosis not present

## 2022-10-11 DIAGNOSIS — Z720 Tobacco use: Secondary | ICD-10-CM | POA: Diagnosis not present

## 2022-10-11 DIAGNOSIS — E782 Mixed hyperlipidemia: Secondary | ICD-10-CM | POA: Diagnosis not present

## 2022-10-11 DIAGNOSIS — Z7982 Long term (current) use of aspirin: Secondary | ICD-10-CM | POA: Diagnosis not present

## 2022-10-11 DIAGNOSIS — R2243 Localized swelling, mass and lump, lower limb, bilateral: Secondary | ICD-10-CM | POA: Diagnosis not present

## 2022-10-11 DIAGNOSIS — Z79899 Other long term (current) drug therapy: Secondary | ICD-10-CM | POA: Diagnosis not present

## 2022-10-11 DIAGNOSIS — Z955 Presence of coronary angioplasty implant and graft: Secondary | ICD-10-CM | POA: Diagnosis not present

## 2022-10-11 DIAGNOSIS — I442 Atrioventricular block, complete: Secondary | ICD-10-CM | POA: Diagnosis not present

## 2022-10-11 DIAGNOSIS — I4581 Long QT syndrome: Secondary | ICD-10-CM | POA: Diagnosis not present

## 2022-10-11 DIAGNOSIS — I447 Left bundle-branch block, unspecified: Secondary | ICD-10-CM | POA: Diagnosis not present

## 2022-10-11 DIAGNOSIS — R9431 Abnormal electrocardiogram [ECG] [EKG]: Secondary | ICD-10-CM | POA: Diagnosis not present

## 2022-10-11 DIAGNOSIS — R600 Localized edema: Secondary | ICD-10-CM | POA: Diagnosis not present

## 2022-10-11 DIAGNOSIS — Z950 Presence of cardiac pacemaker: Secondary | ICD-10-CM | POA: Diagnosis not present

## 2022-10-12 DIAGNOSIS — R600 Localized edema: Secondary | ICD-10-CM | POA: Diagnosis not present

## 2022-10-12 DIAGNOSIS — M25472 Effusion, left ankle: Secondary | ICD-10-CM | POA: Diagnosis not present

## 2022-10-13 DIAGNOSIS — I739 Peripheral vascular disease, unspecified: Secondary | ICD-10-CM | POA: Diagnosis not present

## 2022-10-13 DIAGNOSIS — L03116 Cellulitis of left lower limb: Secondary | ICD-10-CM | POA: Diagnosis not present

## 2022-10-13 DIAGNOSIS — L03115 Cellulitis of right lower limb: Secondary | ICD-10-CM | POA: Diagnosis not present

## 2022-10-13 DIAGNOSIS — R600 Localized edema: Secondary | ICD-10-CM | POA: Diagnosis not present

## 2022-10-17 DIAGNOSIS — R600 Localized edema: Secondary | ICD-10-CM | POA: Diagnosis not present

## 2022-10-17 DIAGNOSIS — I8310 Varicose veins of unspecified lower extremity with inflammation: Secondary | ICD-10-CM | POA: Diagnosis not present

## 2022-10-25 DIAGNOSIS — I4581 Long QT syndrome: Secondary | ICD-10-CM | POA: Diagnosis not present

## 2022-10-25 DIAGNOSIS — Z720 Tobacco use: Secondary | ICD-10-CM | POA: Diagnosis not present

## 2022-10-25 DIAGNOSIS — Z950 Presence of cardiac pacemaker: Secondary | ICD-10-CM | POA: Diagnosis not present

## 2022-10-25 DIAGNOSIS — I2510 Atherosclerotic heart disease of native coronary artery without angina pectoris: Secondary | ICD-10-CM | POA: Diagnosis not present

## 2022-10-25 DIAGNOSIS — I442 Atrioventricular block, complete: Secondary | ICD-10-CM | POA: Diagnosis not present

## 2022-10-25 DIAGNOSIS — R000 Tachycardia, unspecified: Secondary | ICD-10-CM | POA: Diagnosis not present

## 2022-10-25 DIAGNOSIS — I447 Left bundle-branch block, unspecified: Secondary | ICD-10-CM | POA: Diagnosis not present

## 2022-10-25 DIAGNOSIS — I110 Hypertensive heart disease with heart failure: Secondary | ICD-10-CM | POA: Diagnosis not present

## 2022-10-25 DIAGNOSIS — Z955 Presence of coronary angioplasty implant and graft: Secondary | ICD-10-CM | POA: Diagnosis not present

## 2022-10-25 DIAGNOSIS — Z79899 Other long term (current) drug therapy: Secondary | ICD-10-CM | POA: Diagnosis not present

## 2022-10-25 DIAGNOSIS — I501 Left ventricular failure: Secondary | ICD-10-CM | POA: Diagnosis not present

## 2022-10-25 DIAGNOSIS — I440 Atrioventricular block, first degree: Secondary | ICD-10-CM | POA: Diagnosis not present

## 2022-10-25 DIAGNOSIS — I10 Essential (primary) hypertension: Secondary | ICD-10-CM | POA: Diagnosis not present

## 2022-10-25 DIAGNOSIS — Z952 Presence of prosthetic heart valve: Secondary | ICD-10-CM | POA: Diagnosis not present

## 2022-10-25 DIAGNOSIS — Z7982 Long term (current) use of aspirin: Secondary | ICD-10-CM | POA: Diagnosis not present

## 2022-10-25 DIAGNOSIS — R600 Localized edema: Secondary | ICD-10-CM | POA: Diagnosis not present

## 2022-11-08 DIAGNOSIS — D539 Nutritional anemia, unspecified: Secondary | ICD-10-CM | POA: Diagnosis not present

## 2022-11-09 DIAGNOSIS — Z6828 Body mass index (BMI) 28.0-28.9, adult: Secondary | ICD-10-CM | POA: Diagnosis not present

## 2022-11-09 DIAGNOSIS — M5416 Radiculopathy, lumbar region: Secondary | ICD-10-CM | POA: Diagnosis not present

## 2022-11-17 DIAGNOSIS — R310 Gross hematuria: Secondary | ICD-10-CM | POA: Diagnosis not present

## 2022-11-17 DIAGNOSIS — I10 Essential (primary) hypertension: Secondary | ICD-10-CM | POA: Diagnosis not present

## 2022-11-17 DIAGNOSIS — N1831 Chronic kidney disease, stage 3a: Secondary | ICD-10-CM | POA: Diagnosis not present

## 2022-11-17 DIAGNOSIS — G2581 Restless legs syndrome: Secondary | ICD-10-CM | POA: Diagnosis not present

## 2022-11-17 DIAGNOSIS — R7303 Prediabetes: Secondary | ICD-10-CM | POA: Diagnosis not present

## 2022-11-17 DIAGNOSIS — J449 Chronic obstructive pulmonary disease, unspecified: Secondary | ICD-10-CM | POA: Diagnosis not present

## 2022-11-17 DIAGNOSIS — D539 Nutritional anemia, unspecified: Secondary | ICD-10-CM | POA: Diagnosis not present

## 2022-11-17 DIAGNOSIS — E875 Hyperkalemia: Secondary | ICD-10-CM | POA: Diagnosis not present

## 2022-11-17 DIAGNOSIS — I350 Nonrheumatic aortic (valve) stenosis: Secondary | ICD-10-CM | POA: Diagnosis not present

## 2022-11-17 DIAGNOSIS — E785 Hyperlipidemia, unspecified: Secondary | ICD-10-CM | POA: Diagnosis not present

## 2022-11-28 DIAGNOSIS — L97921 Non-pressure chronic ulcer of unspecified part of left lower leg limited to breakdown of skin: Secondary | ICD-10-CM | POA: Diagnosis not present

## 2022-11-28 DIAGNOSIS — R600 Localized edema: Secondary | ICD-10-CM | POA: Diagnosis not present

## 2022-11-28 DIAGNOSIS — L97911 Non-pressure chronic ulcer of unspecified part of right lower leg limited to breakdown of skin: Secondary | ICD-10-CM | POA: Diagnosis not present

## 2022-11-28 DIAGNOSIS — I8310 Varicose veins of unspecified lower extremity with inflammation: Secondary | ICD-10-CM | POA: Diagnosis not present

## 2022-12-12 DIAGNOSIS — R600 Localized edema: Secondary | ICD-10-CM | POA: Diagnosis not present

## 2022-12-12 DIAGNOSIS — Z952 Presence of prosthetic heart valve: Secondary | ICD-10-CM | POA: Diagnosis not present

## 2022-12-12 DIAGNOSIS — I77810 Thoracic aortic ectasia: Secondary | ICD-10-CM | POA: Diagnosis not present

## 2022-12-12 DIAGNOSIS — I080 Rheumatic disorders of both mitral and aortic valves: Secondary | ICD-10-CM | POA: Diagnosis not present

## 2022-12-12 DIAGNOSIS — I2510 Atherosclerotic heart disease of native coronary artery without angina pectoris: Secondary | ICD-10-CM | POA: Diagnosis not present

## 2022-12-12 DIAGNOSIS — I8310 Varicose veins of unspecified lower extremity with inflammation: Secondary | ICD-10-CM | POA: Diagnosis not present

## 2022-12-15 DIAGNOSIS — I442 Atrioventricular block, complete: Secondary | ICD-10-CM | POA: Diagnosis not present

## 2022-12-15 DIAGNOSIS — Z950 Presence of cardiac pacemaker: Secondary | ICD-10-CM | POA: Diagnosis not present

## 2022-12-18 DIAGNOSIS — M5416 Radiculopathy, lumbar region: Secondary | ICD-10-CM | POA: Diagnosis not present

## 2023-01-15 DIAGNOSIS — L578 Other skin changes due to chronic exposure to nonionizing radiation: Secondary | ICD-10-CM | POA: Diagnosis not present

## 2023-01-15 DIAGNOSIS — L821 Other seborrheic keratosis: Secondary | ICD-10-CM | POA: Diagnosis not present

## 2023-01-15 DIAGNOSIS — L570 Actinic keratosis: Secondary | ICD-10-CM | POA: Diagnosis not present

## 2023-01-15 DIAGNOSIS — L814 Other melanin hyperpigmentation: Secondary | ICD-10-CM | POA: Diagnosis not present

## 2023-01-15 DIAGNOSIS — C44629 Squamous cell carcinoma of skin of left upper limb, including shoulder: Secondary | ICD-10-CM | POA: Diagnosis not present

## 2023-01-15 DIAGNOSIS — D225 Melanocytic nevi of trunk: Secondary | ICD-10-CM | POA: Diagnosis not present

## 2023-02-16 DIAGNOSIS — E7800 Pure hypercholesterolemia, unspecified: Secondary | ICD-10-CM | POA: Diagnosis not present

## 2023-02-16 DIAGNOSIS — I519 Heart disease, unspecified: Secondary | ICD-10-CM | POA: Diagnosis not present

## 2023-02-16 DIAGNOSIS — E782 Mixed hyperlipidemia: Secondary | ICD-10-CM | POA: Diagnosis not present

## 2023-02-16 DIAGNOSIS — Z8774 Personal history of (corrected) congenital malformations of heart and circulatory system: Secondary | ICD-10-CM | POA: Diagnosis not present

## 2023-02-16 DIAGNOSIS — Z8679 Personal history of other diseases of the circulatory system: Secondary | ICD-10-CM | POA: Diagnosis not present

## 2023-02-16 DIAGNOSIS — I10 Essential (primary) hypertension: Secondary | ICD-10-CM | POA: Diagnosis not present

## 2023-02-16 DIAGNOSIS — Z720 Tobacco use: Secondary | ICD-10-CM | POA: Diagnosis not present

## 2023-02-16 DIAGNOSIS — I119 Hypertensive heart disease without heart failure: Secondary | ICD-10-CM | POA: Diagnosis not present

## 2023-02-16 DIAGNOSIS — Z952 Presence of prosthetic heart valve: Secondary | ICD-10-CM | POA: Diagnosis not present

## 2023-02-16 DIAGNOSIS — I2510 Atherosclerotic heart disease of native coronary artery without angina pectoris: Secondary | ICD-10-CM | POA: Diagnosis not present

## 2023-02-16 DIAGNOSIS — Z7982 Long term (current) use of aspirin: Secondary | ICD-10-CM | POA: Diagnosis not present

## 2023-02-16 DIAGNOSIS — I442 Atrioventricular block, complete: Secondary | ICD-10-CM | POA: Diagnosis not present

## 2023-02-16 DIAGNOSIS — Z950 Presence of cardiac pacemaker: Secondary | ICD-10-CM | POA: Diagnosis not present

## 2023-03-01 DIAGNOSIS — M48061 Spinal stenosis, lumbar region without neurogenic claudication: Secondary | ICD-10-CM | POA: Diagnosis not present

## 2023-03-01 DIAGNOSIS — M25551 Pain in right hip: Secondary | ICD-10-CM | POA: Diagnosis not present

## 2023-03-12 DIAGNOSIS — Z950 Presence of cardiac pacemaker: Secondary | ICD-10-CM | POA: Diagnosis not present

## 2023-03-12 DIAGNOSIS — I2510 Atherosclerotic heart disease of native coronary artery without angina pectoris: Secondary | ICD-10-CM | POA: Diagnosis not present

## 2023-03-12 DIAGNOSIS — I442 Atrioventricular block, complete: Secondary | ICD-10-CM | POA: Diagnosis not present

## 2023-03-12 DIAGNOSIS — I491 Atrial premature depolarization: Secondary | ICD-10-CM | POA: Diagnosis not present

## 2023-03-12 DIAGNOSIS — Z955 Presence of coronary angioplasty implant and graft: Secondary | ICD-10-CM | POA: Diagnosis not present

## 2023-03-12 DIAGNOSIS — I10 Essential (primary) hypertension: Secondary | ICD-10-CM | POA: Diagnosis not present

## 2023-03-12 DIAGNOSIS — I440 Atrioventricular block, first degree: Secondary | ICD-10-CM | POA: Diagnosis not present

## 2023-03-12 DIAGNOSIS — K219 Gastro-esophageal reflux disease without esophagitis: Secondary | ICD-10-CM | POA: Diagnosis not present

## 2023-03-12 DIAGNOSIS — I447 Left bundle-branch block, unspecified: Secondary | ICD-10-CM | POA: Diagnosis not present

## 2023-03-12 DIAGNOSIS — Z952 Presence of prosthetic heart valve: Secondary | ICD-10-CM | POA: Diagnosis not present

## 2023-03-12 DIAGNOSIS — Z8679 Personal history of other diseases of the circulatory system: Secondary | ICD-10-CM | POA: Diagnosis not present

## 2023-03-13 DIAGNOSIS — D539 Nutritional anemia, unspecified: Secondary | ICD-10-CM | POA: Diagnosis not present

## 2023-03-19 DIAGNOSIS — I2510 Atherosclerotic heart disease of native coronary artery without angina pectoris: Secondary | ICD-10-CM | POA: Diagnosis not present

## 2023-03-19 DIAGNOSIS — I509 Heart failure, unspecified: Secondary | ICD-10-CM | POA: Diagnosis not present

## 2023-03-19 DIAGNOSIS — E782 Mixed hyperlipidemia: Secondary | ICD-10-CM | POA: Diagnosis not present

## 2023-03-19 DIAGNOSIS — Z955 Presence of coronary angioplasty implant and graft: Secondary | ICD-10-CM | POA: Diagnosis not present

## 2023-03-19 DIAGNOSIS — Z952 Presence of prosthetic heart valve: Secondary | ICD-10-CM | POA: Diagnosis not present

## 2023-03-19 DIAGNOSIS — E7800 Pure hypercholesterolemia, unspecified: Secondary | ICD-10-CM | POA: Diagnosis not present

## 2023-03-20 DIAGNOSIS — N1831 Chronic kidney disease, stage 3a: Secondary | ICD-10-CM | POA: Diagnosis not present

## 2023-03-20 DIAGNOSIS — D539 Nutritional anemia, unspecified: Secondary | ICD-10-CM | POA: Diagnosis not present

## 2023-03-20 DIAGNOSIS — Z6830 Body mass index (BMI) 30.0-30.9, adult: Secondary | ICD-10-CM | POA: Diagnosis not present

## 2023-03-20 DIAGNOSIS — I350 Nonrheumatic aortic (valve) stenosis: Secondary | ICD-10-CM | POA: Diagnosis not present

## 2023-03-20 DIAGNOSIS — I10 Essential (primary) hypertension: Secondary | ICD-10-CM | POA: Diagnosis not present

## 2023-03-20 DIAGNOSIS — Z125 Encounter for screening for malignant neoplasm of prostate: Secondary | ICD-10-CM | POA: Diagnosis not present

## 2023-03-20 DIAGNOSIS — E785 Hyperlipidemia, unspecified: Secondary | ICD-10-CM | POA: Diagnosis not present

## 2023-03-20 DIAGNOSIS — R7303 Prediabetes: Secondary | ICD-10-CM | POA: Diagnosis not present

## 2023-03-20 DIAGNOSIS — Z720 Tobacco use: Secondary | ICD-10-CM | POA: Diagnosis not present

## 2023-03-20 DIAGNOSIS — E875 Hyperkalemia: Secondary | ICD-10-CM | POA: Diagnosis not present

## 2023-03-20 DIAGNOSIS — G2581 Restless legs syndrome: Secondary | ICD-10-CM | POA: Diagnosis not present

## 2023-03-20 DIAGNOSIS — J449 Chronic obstructive pulmonary disease, unspecified: Secondary | ICD-10-CM | POA: Diagnosis not present

## 2023-03-20 DIAGNOSIS — M109 Gout, unspecified: Secondary | ICD-10-CM | POA: Diagnosis not present

## 2023-03-21 DIAGNOSIS — M5416 Radiculopathy, lumbar region: Secondary | ICD-10-CM | POA: Diagnosis not present

## 2023-03-29 DIAGNOSIS — R600 Localized edema: Secondary | ICD-10-CM | POA: Diagnosis not present

## 2023-03-29 DIAGNOSIS — I519 Heart disease, unspecified: Secondary | ICD-10-CM | POA: Diagnosis not present

## 2023-04-02 DIAGNOSIS — I5022 Chronic systolic (congestive) heart failure: Secondary | ICD-10-CM | POA: Diagnosis not present

## 2023-04-02 DIAGNOSIS — E875 Hyperkalemia: Secondary | ICD-10-CM | POA: Diagnosis not present

## 2023-04-02 DIAGNOSIS — Z950 Presence of cardiac pacemaker: Secondary | ICD-10-CM | POA: Diagnosis not present

## 2023-04-02 DIAGNOSIS — Z952 Presence of prosthetic heart valve: Secondary | ICD-10-CM | POA: Diagnosis not present

## 2023-04-02 DIAGNOSIS — I2510 Atherosclerotic heart disease of native coronary artery without angina pectoris: Secondary | ICD-10-CM | POA: Diagnosis not present

## 2023-04-02 DIAGNOSIS — E782 Mixed hyperlipidemia: Secondary | ICD-10-CM | POA: Diagnosis not present

## 2023-04-02 DIAGNOSIS — Z955 Presence of coronary angioplasty implant and graft: Secondary | ICD-10-CM | POA: Diagnosis not present

## 2023-04-02 DIAGNOSIS — I442 Atrioventricular block, complete: Secondary | ICD-10-CM | POA: Diagnosis not present

## 2023-04-02 DIAGNOSIS — I519 Heart disease, unspecified: Secondary | ICD-10-CM | POA: Diagnosis not present

## 2023-04-02 DIAGNOSIS — Z8679 Personal history of other diseases of the circulatory system: Secondary | ICD-10-CM | POA: Diagnosis not present

## 2023-04-09 DIAGNOSIS — N1831 Chronic kidney disease, stage 3a: Secondary | ICD-10-CM | POA: Diagnosis not present

## 2023-04-17 DIAGNOSIS — Z952 Presence of prosthetic heart valve: Secondary | ICD-10-CM | POA: Diagnosis not present

## 2023-04-17 DIAGNOSIS — I5022 Chronic systolic (congestive) heart failure: Secondary | ICD-10-CM | POA: Diagnosis not present

## 2023-04-17 DIAGNOSIS — Z950 Presence of cardiac pacemaker: Secondary | ICD-10-CM | POA: Diagnosis not present

## 2023-04-17 DIAGNOSIS — Z955 Presence of coronary angioplasty implant and graft: Secondary | ICD-10-CM | POA: Diagnosis not present

## 2023-04-17 DIAGNOSIS — I708 Atherosclerosis of other arteries: Secondary | ICD-10-CM | POA: Diagnosis not present

## 2023-04-17 DIAGNOSIS — I2510 Atherosclerotic heart disease of native coronary artery without angina pectoris: Secondary | ICD-10-CM | POA: Diagnosis not present

## 2023-04-17 DIAGNOSIS — J449 Chronic obstructive pulmonary disease, unspecified: Secondary | ICD-10-CM | POA: Diagnosis not present

## 2023-04-17 DIAGNOSIS — I110 Hypertensive heart disease with heart failure: Secondary | ICD-10-CM | POA: Diagnosis not present

## 2023-04-17 DIAGNOSIS — E785 Hyperlipidemia, unspecified: Secondary | ICD-10-CM | POA: Diagnosis not present

## 2023-04-17 DIAGNOSIS — F17210 Nicotine dependence, cigarettes, uncomplicated: Secondary | ICD-10-CM | POA: Diagnosis not present

## 2023-04-17 DIAGNOSIS — I25118 Atherosclerotic heart disease of native coronary artery with other forms of angina pectoris: Secondary | ICD-10-CM | POA: Diagnosis not present

## 2023-04-17 DIAGNOSIS — Z79899 Other long term (current) drug therapy: Secondary | ICD-10-CM | POA: Diagnosis not present

## 2023-04-30 DIAGNOSIS — Z952 Presence of prosthetic heart valve: Secondary | ICD-10-CM | POA: Diagnosis not present

## 2023-04-30 DIAGNOSIS — Z955 Presence of coronary angioplasty implant and graft: Secondary | ICD-10-CM | POA: Diagnosis not present

## 2023-04-30 DIAGNOSIS — Z950 Presence of cardiac pacemaker: Secondary | ICD-10-CM | POA: Diagnosis not present

## 2023-04-30 DIAGNOSIS — I2510 Atherosclerotic heart disease of native coronary artery without angina pectoris: Secondary | ICD-10-CM | POA: Diagnosis not present

## 2023-04-30 DIAGNOSIS — I519 Heart disease, unspecified: Secondary | ICD-10-CM | POA: Diagnosis not present

## 2023-04-30 DIAGNOSIS — E782 Mixed hyperlipidemia: Secondary | ICD-10-CM | POA: Diagnosis not present

## 2023-04-30 DIAGNOSIS — I5022 Chronic systolic (congestive) heart failure: Secondary | ICD-10-CM | POA: Diagnosis not present

## 2023-04-30 DIAGNOSIS — I442 Atrioventricular block, complete: Secondary | ICD-10-CM | POA: Diagnosis not present

## 2023-04-30 DIAGNOSIS — Z8679 Personal history of other diseases of the circulatory system: Secondary | ICD-10-CM | POA: Diagnosis not present

## 2023-06-07 DIAGNOSIS — I442 Atrioventricular block, complete: Secondary | ICD-10-CM | POA: Diagnosis not present

## 2023-06-07 DIAGNOSIS — Z45018 Encounter for adjustment and management of other part of cardiac pacemaker: Secondary | ICD-10-CM | POA: Diagnosis not present

## 2023-06-25 DIAGNOSIS — M5416 Radiculopathy, lumbar region: Secondary | ICD-10-CM | POA: Diagnosis not present

## 2023-07-03 DIAGNOSIS — L820 Inflamed seborrheic keratosis: Secondary | ICD-10-CM | POA: Diagnosis not present

## 2023-07-08 DIAGNOSIS — I442 Atrioventricular block, complete: Secondary | ICD-10-CM | POA: Diagnosis not present

## 2023-07-08 DIAGNOSIS — Z45018 Encounter for adjustment and management of other part of cardiac pacemaker: Secondary | ICD-10-CM | POA: Diagnosis not present

## 2023-07-16 DIAGNOSIS — Z9181 History of falling: Secondary | ICD-10-CM | POA: Diagnosis not present

## 2023-07-16 DIAGNOSIS — Z1331 Encounter for screening for depression: Secondary | ICD-10-CM | POA: Diagnosis not present

## 2023-07-16 DIAGNOSIS — Z0000 Encounter for general adult medical examination without abnormal findings: Secondary | ICD-10-CM | POA: Diagnosis not present

## 2023-07-20 DIAGNOSIS — I10 Essential (primary) hypertension: Secondary | ICD-10-CM | POA: Diagnosis not present

## 2023-07-20 DIAGNOSIS — E785 Hyperlipidemia, unspecified: Secondary | ICD-10-CM | POA: Diagnosis not present

## 2023-07-20 DIAGNOSIS — D539 Nutritional anemia, unspecified: Secondary | ICD-10-CM | POA: Diagnosis not present

## 2023-07-20 DIAGNOSIS — R7303 Prediabetes: Secondary | ICD-10-CM | POA: Diagnosis not present

## 2023-07-27 DIAGNOSIS — I10 Essential (primary) hypertension: Secondary | ICD-10-CM | POA: Diagnosis not present

## 2023-07-27 DIAGNOSIS — I2510 Atherosclerotic heart disease of native coronary artery without angina pectoris: Secondary | ICD-10-CM | POA: Diagnosis not present

## 2023-07-27 DIAGNOSIS — Z6830 Body mass index (BMI) 30.0-30.9, adult: Secondary | ICD-10-CM | POA: Diagnosis not present

## 2023-07-27 DIAGNOSIS — R7303 Prediabetes: Secondary | ICD-10-CM | POA: Diagnosis not present

## 2023-07-27 DIAGNOSIS — I350 Nonrheumatic aortic (valve) stenosis: Secondary | ICD-10-CM | POA: Diagnosis not present

## 2023-07-27 DIAGNOSIS — Z720 Tobacco use: Secondary | ICD-10-CM | POA: Diagnosis not present

## 2023-07-27 DIAGNOSIS — J449 Chronic obstructive pulmonary disease, unspecified: Secondary | ICD-10-CM | POA: Diagnosis not present

## 2023-07-27 DIAGNOSIS — D539 Nutritional anemia, unspecified: Secondary | ICD-10-CM | POA: Diagnosis not present

## 2023-07-27 DIAGNOSIS — M109 Gout, unspecified: Secondary | ICD-10-CM | POA: Diagnosis not present

## 2023-07-27 DIAGNOSIS — G2581 Restless legs syndrome: Secondary | ICD-10-CM | POA: Diagnosis not present

## 2023-07-27 DIAGNOSIS — N1831 Chronic kidney disease, stage 3a: Secondary | ICD-10-CM | POA: Diagnosis not present

## 2023-07-27 DIAGNOSIS — E785 Hyperlipidemia, unspecified: Secondary | ICD-10-CM | POA: Diagnosis not present

## 2023-09-10 DIAGNOSIS — Z8774 Personal history of (corrected) congenital malformations of heart and circulatory system: Secondary | ICD-10-CM | POA: Diagnosis not present

## 2023-09-10 DIAGNOSIS — Z860101 Personal history of adenomatous and serrated colon polyps: Secondary | ICD-10-CM | POA: Diagnosis not present

## 2023-09-10 DIAGNOSIS — Z1211 Encounter for screening for malignant neoplasm of colon: Secondary | ICD-10-CM | POA: Diagnosis not present

## 2023-09-10 DIAGNOSIS — R1310 Dysphagia, unspecified: Secondary | ICD-10-CM | POA: Diagnosis not present

## 2023-09-21 DIAGNOSIS — Z45018 Encounter for adjustment and management of other part of cardiac pacemaker: Secondary | ICD-10-CM | POA: Diagnosis not present

## 2023-10-01 DIAGNOSIS — M5416 Radiculopathy, lumbar region: Secondary | ICD-10-CM | POA: Diagnosis not present

## 2023-11-09 DIAGNOSIS — Z950 Presence of cardiac pacemaker: Secondary | ICD-10-CM | POA: Diagnosis not present

## 2023-11-09 DIAGNOSIS — I10 Essential (primary) hypertension: Secondary | ICD-10-CM | POA: Diagnosis not present

## 2023-11-09 DIAGNOSIS — I2510 Atherosclerotic heart disease of native coronary artery without angina pectoris: Secondary | ICD-10-CM | POA: Diagnosis not present

## 2023-11-09 DIAGNOSIS — Z95818 Presence of other cardiac implants and grafts: Secondary | ICD-10-CM | POA: Diagnosis not present

## 2023-11-09 DIAGNOSIS — Z952 Presence of prosthetic heart valve: Secondary | ICD-10-CM | POA: Diagnosis not present

## 2023-11-09 DIAGNOSIS — E782 Mixed hyperlipidemia: Secondary | ICD-10-CM | POA: Diagnosis not present

## 2023-11-19 DIAGNOSIS — I10 Essential (primary) hypertension: Secondary | ICD-10-CM | POA: Diagnosis not present

## 2023-11-19 DIAGNOSIS — E785 Hyperlipidemia, unspecified: Secondary | ICD-10-CM | POA: Diagnosis not present

## 2023-11-19 DIAGNOSIS — D539 Nutritional anemia, unspecified: Secondary | ICD-10-CM | POA: Diagnosis not present

## 2023-11-19 DIAGNOSIS — R7303 Prediabetes: Secondary | ICD-10-CM | POA: Diagnosis not present

## 2023-11-26 DIAGNOSIS — R7303 Prediabetes: Secondary | ICD-10-CM | POA: Diagnosis not present

## 2023-11-26 DIAGNOSIS — Z720 Tobacco use: Secondary | ICD-10-CM | POA: Diagnosis not present

## 2023-11-26 DIAGNOSIS — J449 Chronic obstructive pulmonary disease, unspecified: Secondary | ICD-10-CM | POA: Diagnosis not present

## 2023-11-26 DIAGNOSIS — N1831 Chronic kidney disease, stage 3a: Secondary | ICD-10-CM | POA: Diagnosis not present

## 2023-11-26 DIAGNOSIS — M109 Gout, unspecified: Secondary | ICD-10-CM | POA: Diagnosis not present

## 2023-11-26 DIAGNOSIS — I10 Essential (primary) hypertension: Secondary | ICD-10-CM | POA: Diagnosis not present

## 2023-11-26 DIAGNOSIS — G2581 Restless legs syndrome: Secondary | ICD-10-CM | POA: Diagnosis not present

## 2023-11-26 DIAGNOSIS — D539 Nutritional anemia, unspecified: Secondary | ICD-10-CM | POA: Diagnosis not present

## 2023-11-26 DIAGNOSIS — E875 Hyperkalemia: Secondary | ICD-10-CM | POA: Diagnosis not present

## 2023-11-26 DIAGNOSIS — I350 Nonrheumatic aortic (valve) stenosis: Secondary | ICD-10-CM | POA: Diagnosis not present

## 2023-11-26 DIAGNOSIS — E785 Hyperlipidemia, unspecified: Secondary | ICD-10-CM | POA: Diagnosis not present

## 2023-11-29 DIAGNOSIS — I10 Essential (primary) hypertension: Secondary | ICD-10-CM | POA: Diagnosis not present

## 2023-11-29 DIAGNOSIS — K31A19 Gastric intestinal metaplasia without dysplasia, unspecified site: Secondary | ICD-10-CM | POA: Diagnosis not present

## 2023-11-29 DIAGNOSIS — K222 Esophageal obstruction: Secondary | ICD-10-CM | POA: Diagnosis not present

## 2023-11-29 DIAGNOSIS — Z8719 Personal history of other diseases of the digestive system: Secondary | ICD-10-CM | POA: Diagnosis not present

## 2023-11-29 DIAGNOSIS — R1314 Dysphagia, pharyngoesophageal phase: Secondary | ICD-10-CM | POA: Diagnosis not present

## 2023-11-29 DIAGNOSIS — Z7982 Long term (current) use of aspirin: Secondary | ICD-10-CM | POA: Diagnosis not present

## 2023-11-29 DIAGNOSIS — Z1211 Encounter for screening for malignant neoplasm of colon: Secondary | ICD-10-CM | POA: Diagnosis not present

## 2023-11-29 DIAGNOSIS — Z950 Presence of cardiac pacemaker: Secondary | ICD-10-CM | POA: Diagnosis not present

## 2023-11-29 DIAGNOSIS — Z862 Personal history of diseases of the blood and blood-forming organs and certain disorders involving the immune mechanism: Secondary | ICD-10-CM | POA: Diagnosis not present

## 2023-11-29 DIAGNOSIS — Z79899 Other long term (current) drug therapy: Secondary | ICD-10-CM | POA: Diagnosis not present

## 2023-11-29 DIAGNOSIS — D124 Benign neoplasm of descending colon: Secondary | ICD-10-CM | POA: Diagnosis not present

## 2023-11-29 DIAGNOSIS — Z860101 Personal history of adenomatous and serrated colon polyps: Secondary | ICD-10-CM | POA: Diagnosis not present

## 2023-11-29 DIAGNOSIS — K3189 Other diseases of stomach and duodenum: Secondary | ICD-10-CM | POA: Diagnosis not present

## 2023-11-29 DIAGNOSIS — Z955 Presence of coronary angioplasty implant and graft: Secondary | ICD-10-CM | POA: Diagnosis not present

## 2023-11-29 DIAGNOSIS — D125 Benign neoplasm of sigmoid colon: Secondary | ICD-10-CM | POA: Diagnosis not present

## 2023-11-29 DIAGNOSIS — K31811 Angiodysplasia of stomach and duodenum with bleeding: Secondary | ICD-10-CM | POA: Diagnosis not present

## 2023-11-29 DIAGNOSIS — K2950 Unspecified chronic gastritis without bleeding: Secondary | ICD-10-CM | POA: Diagnosis not present

## 2023-11-29 DIAGNOSIS — K5730 Diverticulosis of large intestine without perforation or abscess without bleeding: Secondary | ICD-10-CM | POA: Diagnosis not present

## 2023-11-29 DIAGNOSIS — Z952 Presence of prosthetic heart valve: Secondary | ICD-10-CM | POA: Diagnosis not present

## 2023-11-29 DIAGNOSIS — K449 Diaphragmatic hernia without obstruction or gangrene: Secondary | ICD-10-CM | POA: Diagnosis not present

## 2023-11-29 DIAGNOSIS — K635 Polyp of colon: Secondary | ICD-10-CM | POA: Diagnosis not present

## 2023-11-29 DIAGNOSIS — K640 First degree hemorrhoids: Secondary | ICD-10-CM | POA: Diagnosis not present

## 2023-12-06 DIAGNOSIS — Z45010 Encounter for checking and testing of cardiac pacemaker pulse generator [battery]: Secondary | ICD-10-CM | POA: Diagnosis not present

## 2024-01-01 DIAGNOSIS — M5416 Radiculopathy, lumbar region: Secondary | ICD-10-CM | POA: Diagnosis not present

## 2024-01-04 DIAGNOSIS — Z45010 Encounter for checking and testing of cardiac pacemaker pulse generator [battery]: Secondary | ICD-10-CM | POA: Diagnosis not present

## 2024-01-08 DIAGNOSIS — R2681 Unsteadiness on feet: Secondary | ICD-10-CM | POA: Diagnosis not present

## 2024-01-08 DIAGNOSIS — M6281 Muscle weakness (generalized): Secondary | ICD-10-CM | POA: Diagnosis not present

## 2024-01-08 DIAGNOSIS — I10 Essential (primary) hypertension: Secondary | ICD-10-CM | POA: Diagnosis not present

## 2024-01-08 DIAGNOSIS — Z6830 Body mass index (BMI) 30.0-30.9, adult: Secondary | ICD-10-CM | POA: Diagnosis not present

## 2024-01-15 DIAGNOSIS — R233 Spontaneous ecchymoses: Secondary | ICD-10-CM | POA: Diagnosis not present

## 2024-01-15 DIAGNOSIS — L720 Epidermal cyst: Secondary | ICD-10-CM | POA: Diagnosis not present

## 2024-01-15 DIAGNOSIS — L821 Other seborrheic keratosis: Secondary | ICD-10-CM | POA: Diagnosis not present

## 2024-01-15 DIAGNOSIS — L578 Other skin changes due to chronic exposure to nonionizing radiation: Secondary | ICD-10-CM | POA: Diagnosis not present

## 2024-01-28 DIAGNOSIS — M6281 Muscle weakness (generalized): Secondary | ICD-10-CM | POA: Diagnosis not present

## 2024-01-28 DIAGNOSIS — R2681 Unsteadiness on feet: Secondary | ICD-10-CM | POA: Diagnosis not present

## 2024-01-28 DIAGNOSIS — R296 Repeated falls: Secondary | ICD-10-CM | POA: Diagnosis not present

## 2024-01-28 DIAGNOSIS — R5383 Other fatigue: Secondary | ICD-10-CM | POA: Diagnosis not present

## 2024-01-28 DIAGNOSIS — R293 Abnormal posture: Secondary | ICD-10-CM | POA: Diagnosis not present

## 2024-02-01 DIAGNOSIS — R600 Localized edema: Secondary | ICD-10-CM | POA: Diagnosis not present

## 2024-02-01 DIAGNOSIS — L03115 Cellulitis of right lower limb: Secondary | ICD-10-CM | POA: Diagnosis not present

## 2024-02-01 DIAGNOSIS — M79671 Pain in right foot: Secondary | ICD-10-CM | POA: Diagnosis not present

## 2024-02-04 DIAGNOSIS — L0390 Cellulitis, unspecified: Secondary | ICD-10-CM | POA: Diagnosis not present

## 2024-02-04 DIAGNOSIS — L97911 Non-pressure chronic ulcer of unspecified part of right lower leg limited to breakdown of skin: Secondary | ICD-10-CM | POA: Diagnosis not present

## 2024-02-04 DIAGNOSIS — I872 Venous insufficiency (chronic) (peripheral): Secondary | ICD-10-CM | POA: Diagnosis not present

## 2024-02-12 DIAGNOSIS — I739 Peripheral vascular disease, unspecified: Secondary | ICD-10-CM | POA: Diagnosis not present

## 2024-02-12 DIAGNOSIS — Z7982 Long term (current) use of aspirin: Secondary | ICD-10-CM | POA: Diagnosis not present

## 2024-02-12 DIAGNOSIS — L03115 Cellulitis of right lower limb: Secondary | ICD-10-CM | POA: Diagnosis not present

## 2024-02-12 DIAGNOSIS — Z79899 Other long term (current) drug therapy: Secondary | ICD-10-CM | POA: Diagnosis not present

## 2024-02-12 DIAGNOSIS — L97811 Non-pressure chronic ulcer of other part of right lower leg limited to breakdown of skin: Secondary | ICD-10-CM | POA: Diagnosis not present

## 2024-02-12 DIAGNOSIS — R600 Localized edema: Secondary | ICD-10-CM | POA: Diagnosis not present

## 2024-02-12 DIAGNOSIS — N179 Acute kidney failure, unspecified: Secondary | ICD-10-CM | POA: Diagnosis not present

## 2024-02-12 DIAGNOSIS — J449 Chronic obstructive pulmonary disease, unspecified: Secondary | ICD-10-CM | POA: Diagnosis not present

## 2024-02-12 DIAGNOSIS — N183 Chronic kidney disease, stage 3 unspecified: Secondary | ICD-10-CM | POA: Diagnosis not present

## 2024-02-12 DIAGNOSIS — M79604 Pain in right leg: Secondary | ICD-10-CM | POA: Diagnosis not present

## 2024-02-12 DIAGNOSIS — I129 Hypertensive chronic kidney disease with stage 1 through stage 4 chronic kidney disease, or unspecified chronic kidney disease: Secondary | ICD-10-CM | POA: Diagnosis not present

## 2024-02-12 DIAGNOSIS — L97919 Non-pressure chronic ulcer of unspecified part of right lower leg with unspecified severity: Secondary | ICD-10-CM | POA: Diagnosis not present

## 2024-02-12 DIAGNOSIS — E871 Hypo-osmolality and hyponatremia: Secondary | ICD-10-CM | POA: Diagnosis not present

## 2024-02-12 DIAGNOSIS — I872 Venous insufficiency (chronic) (peripheral): Secondary | ICD-10-CM | POA: Diagnosis not present

## 2024-02-12 DIAGNOSIS — I2510 Atherosclerotic heart disease of native coronary artery without angina pectoris: Secondary | ICD-10-CM | POA: Diagnosis not present

## 2024-02-12 DIAGNOSIS — Z950 Presence of cardiac pacemaker: Secondary | ICD-10-CM | POA: Diagnosis not present

## 2024-02-12 DIAGNOSIS — F17210 Nicotine dependence, cigarettes, uncomplicated: Secondary | ICD-10-CM | POA: Diagnosis not present

## 2024-02-12 DIAGNOSIS — E785 Hyperlipidemia, unspecified: Secondary | ICD-10-CM | POA: Diagnosis not present

## 2024-02-12 DIAGNOSIS — G629 Polyneuropathy, unspecified: Secondary | ICD-10-CM | POA: Diagnosis not present

## 2024-02-12 DIAGNOSIS — M1990 Unspecified osteoarthritis, unspecified site: Secondary | ICD-10-CM | POA: Diagnosis not present

## 2024-02-12 DIAGNOSIS — I252 Old myocardial infarction: Secondary | ICD-10-CM | POA: Diagnosis not present

## 2024-02-21 DIAGNOSIS — L97319 Non-pressure chronic ulcer of right ankle with unspecified severity: Secondary | ICD-10-CM | POA: Diagnosis not present

## 2024-02-21 DIAGNOSIS — I70233 Atherosclerosis of native arteries of right leg with ulceration of ankle: Secondary | ICD-10-CM | POA: Diagnosis not present

## 2024-02-21 DIAGNOSIS — I872 Venous insufficiency (chronic) (peripheral): Secondary | ICD-10-CM | POA: Diagnosis not present

## 2024-02-25 DIAGNOSIS — I739 Peripheral vascular disease, unspecified: Secondary | ICD-10-CM | POA: Diagnosis not present

## 2024-02-25 DIAGNOSIS — L0390 Cellulitis, unspecified: Secondary | ICD-10-CM | POA: Diagnosis not present

## 2024-02-25 DIAGNOSIS — R7303 Prediabetes: Secondary | ICD-10-CM | POA: Diagnosis not present

## 2024-02-25 DIAGNOSIS — L97911 Non-pressure chronic ulcer of unspecified part of right lower leg limited to breakdown of skin: Secondary | ICD-10-CM | POA: Diagnosis not present

## 2024-02-25 DIAGNOSIS — I2510 Atherosclerotic heart disease of native coronary artery without angina pectoris: Secondary | ICD-10-CM | POA: Diagnosis not present

## 2024-02-25 DIAGNOSIS — L03115 Cellulitis of right lower limb: Secondary | ICD-10-CM | POA: Diagnosis not present

## 2024-02-25 DIAGNOSIS — Z79899 Other long term (current) drug therapy: Secondary | ICD-10-CM | POA: Diagnosis not present

## 2024-02-25 DIAGNOSIS — Z6830 Body mass index (BMI) 30.0-30.9, adult: Secondary | ICD-10-CM | POA: Diagnosis not present

## 2024-02-28 DIAGNOSIS — S81801A Unspecified open wound, right lower leg, initial encounter: Secondary | ICD-10-CM | POA: Diagnosis not present

## 2024-03-03 DIAGNOSIS — I5022 Chronic systolic (congestive) heart failure: Secondary | ICD-10-CM | POA: Diagnosis not present

## 2024-03-03 DIAGNOSIS — I739 Peripheral vascular disease, unspecified: Secondary | ICD-10-CM | POA: Diagnosis not present

## 2024-03-03 DIAGNOSIS — I519 Heart disease, unspecified: Secondary | ICD-10-CM | POA: Diagnosis not present

## 2024-03-03 DIAGNOSIS — Z950 Presence of cardiac pacemaker: Secondary | ICD-10-CM | POA: Diagnosis not present

## 2024-03-03 DIAGNOSIS — Z8679 Personal history of other diseases of the circulatory system: Secondary | ICD-10-CM | POA: Diagnosis not present

## 2024-03-03 DIAGNOSIS — I2510 Atherosclerotic heart disease of native coronary artery without angina pectoris: Secondary | ICD-10-CM | POA: Diagnosis not present

## 2024-03-03 DIAGNOSIS — Z955 Presence of coronary angioplasty implant and graft: Secondary | ICD-10-CM | POA: Diagnosis not present

## 2024-03-03 DIAGNOSIS — E782 Mixed hyperlipidemia: Secondary | ICD-10-CM | POA: Diagnosis not present

## 2024-03-03 DIAGNOSIS — Z952 Presence of prosthetic heart valve: Secondary | ICD-10-CM | POA: Diagnosis not present

## 2024-03-03 DIAGNOSIS — E7800 Pure hypercholesterolemia, unspecified: Secondary | ICD-10-CM | POA: Diagnosis not present

## 2024-03-03 DIAGNOSIS — Z720 Tobacco use: Secondary | ICD-10-CM | POA: Diagnosis not present

## 2024-03-05 DIAGNOSIS — Z955 Presence of coronary angioplasty implant and graft: Secondary | ICD-10-CM | POA: Diagnosis not present

## 2024-03-05 DIAGNOSIS — I442 Atrioventricular block, complete: Secondary | ICD-10-CM | POA: Diagnosis not present

## 2024-03-05 DIAGNOSIS — E7800 Pure hypercholesterolemia, unspecified: Secondary | ICD-10-CM | POA: Diagnosis not present

## 2024-03-05 DIAGNOSIS — Z950 Presence of cardiac pacemaker: Secondary | ICD-10-CM | POA: Diagnosis not present

## 2024-03-05 DIAGNOSIS — Z8679 Personal history of other diseases of the circulatory system: Secondary | ICD-10-CM | POA: Diagnosis not present

## 2024-03-05 DIAGNOSIS — I5022 Chronic systolic (congestive) heart failure: Secondary | ICD-10-CM | POA: Diagnosis not present

## 2024-03-05 DIAGNOSIS — I110 Hypertensive heart disease with heart failure: Secondary | ICD-10-CM | POA: Diagnosis not present

## 2024-03-05 DIAGNOSIS — I2510 Atherosclerotic heart disease of native coronary artery without angina pectoris: Secondary | ICD-10-CM | POA: Diagnosis not present

## 2024-03-06 DIAGNOSIS — I872 Venous insufficiency (chronic) (peripheral): Secondary | ICD-10-CM | POA: Diagnosis not present

## 2024-03-07 DIAGNOSIS — I872 Venous insufficiency (chronic) (peripheral): Secondary | ICD-10-CM | POA: Diagnosis not present

## 2024-03-07 DIAGNOSIS — I739 Peripheral vascular disease, unspecified: Secondary | ICD-10-CM | POA: Diagnosis not present

## 2024-03-11 DIAGNOSIS — Z952 Presence of prosthetic heart valve: Secondary | ICD-10-CM | POA: Diagnosis not present

## 2024-03-11 DIAGNOSIS — I272 Pulmonary hypertension, unspecified: Secondary | ICD-10-CM | POA: Diagnosis not present

## 2024-03-11 DIAGNOSIS — I081 Rheumatic disorders of both mitral and tricuspid valves: Secondary | ICD-10-CM | POA: Diagnosis not present

## 2024-03-18 DIAGNOSIS — Z45018 Encounter for adjustment and management of other part of cardiac pacemaker: Secondary | ICD-10-CM | POA: Diagnosis not present

## 2024-03-18 DIAGNOSIS — I2510 Atherosclerotic heart disease of native coronary artery without angina pectoris: Secondary | ICD-10-CM | POA: Diagnosis not present

## 2024-03-18 DIAGNOSIS — I5022 Chronic systolic (congestive) heart failure: Secondary | ICD-10-CM | POA: Diagnosis not present

## 2024-03-18 DIAGNOSIS — I110 Hypertensive heart disease with heart failure: Secondary | ICD-10-CM | POA: Diagnosis not present

## 2024-03-18 DIAGNOSIS — I442 Atrioventricular block, complete: Secondary | ICD-10-CM | POA: Diagnosis not present

## 2024-03-20 DIAGNOSIS — D539 Nutritional anemia, unspecified: Secondary | ICD-10-CM | POA: Diagnosis not present

## 2024-03-24 DIAGNOSIS — Z6830 Body mass index (BMI) 30.0-30.9, adult: Secondary | ICD-10-CM | POA: Diagnosis not present

## 2024-03-24 DIAGNOSIS — J449 Chronic obstructive pulmonary disease, unspecified: Secondary | ICD-10-CM | POA: Diagnosis not present

## 2024-03-24 DIAGNOSIS — N1831 Chronic kidney disease, stage 3a: Secondary | ICD-10-CM | POA: Diagnosis not present

## 2024-03-24 DIAGNOSIS — J329 Chronic sinusitis, unspecified: Secondary | ICD-10-CM | POA: Diagnosis not present

## 2024-03-24 DIAGNOSIS — I10 Essential (primary) hypertension: Secondary | ICD-10-CM | POA: Diagnosis not present

## 2024-03-24 DIAGNOSIS — M48061 Spinal stenosis, lumbar region without neurogenic claudication: Secondary | ICD-10-CM | POA: Diagnosis not present

## 2024-03-24 DIAGNOSIS — D539 Nutritional anemia, unspecified: Secondary | ICD-10-CM | POA: Diagnosis not present

## 2024-03-24 DIAGNOSIS — R7303 Prediabetes: Secondary | ICD-10-CM | POA: Diagnosis not present

## 2024-03-24 DIAGNOSIS — Z23 Encounter for immunization: Secondary | ICD-10-CM | POA: Diagnosis not present

## 2024-03-24 DIAGNOSIS — Z125 Encounter for screening for malignant neoplasm of prostate: Secondary | ICD-10-CM | POA: Diagnosis not present

## 2024-03-24 DIAGNOSIS — L03115 Cellulitis of right lower limb: Secondary | ICD-10-CM | POA: Diagnosis not present

## 2024-03-24 DIAGNOSIS — E785 Hyperlipidemia, unspecified: Secondary | ICD-10-CM | POA: Diagnosis not present

## 2024-03-31 DIAGNOSIS — I70233 Atherosclerosis of native arteries of right leg with ulceration of ankle: Secondary | ICD-10-CM | POA: Diagnosis not present

## 2024-03-31 DIAGNOSIS — L97819 Non-pressure chronic ulcer of other part of right lower leg with unspecified severity: Secondary | ICD-10-CM | POA: Diagnosis not present

## 2024-03-31 DIAGNOSIS — I872 Venous insufficiency (chronic) (peripheral): Secondary | ICD-10-CM | POA: Diagnosis not present

## 2024-04-02 DIAGNOSIS — I509 Heart failure, unspecified: Secondary | ICD-10-CM | POA: Diagnosis not present

## 2024-04-03 DIAGNOSIS — M5416 Radiculopathy, lumbar region: Secondary | ICD-10-CM | POA: Diagnosis not present

## 2024-04-04 DIAGNOSIS — I872 Venous insufficiency (chronic) (peripheral): Secondary | ICD-10-CM | POA: Diagnosis not present

## 2024-04-04 DIAGNOSIS — E782 Mixed hyperlipidemia: Secondary | ICD-10-CM | POA: Diagnosis not present

## 2024-04-04 DIAGNOSIS — Z952 Presence of prosthetic heart valve: Secondary | ICD-10-CM | POA: Diagnosis not present

## 2024-04-04 DIAGNOSIS — I110 Hypertensive heart disease with heart failure: Secondary | ICD-10-CM | POA: Diagnosis not present

## 2024-04-04 DIAGNOSIS — I2510 Atherosclerotic heart disease of native coronary artery without angina pectoris: Secondary | ICD-10-CM | POA: Diagnosis not present

## 2024-04-04 DIAGNOSIS — I350 Nonrheumatic aortic (valve) stenosis: Secondary | ICD-10-CM | POA: Diagnosis not present

## 2024-04-04 DIAGNOSIS — I495 Sick sinus syndrome: Secondary | ICD-10-CM | POA: Diagnosis not present

## 2024-04-04 DIAGNOSIS — Z955 Presence of coronary angioplasty implant and graft: Secondary | ICD-10-CM | POA: Diagnosis not present

## 2024-04-04 DIAGNOSIS — I739 Peripheral vascular disease, unspecified: Secondary | ICD-10-CM | POA: Diagnosis not present

## 2024-04-04 DIAGNOSIS — I442 Atrioventricular block, complete: Secondary | ICD-10-CM | POA: Diagnosis not present

## 2024-04-04 DIAGNOSIS — I509 Heart failure, unspecified: Secondary | ICD-10-CM | POA: Diagnosis not present

## 2024-04-04 DIAGNOSIS — I429 Cardiomyopathy, unspecified: Secondary | ICD-10-CM | POA: Diagnosis not present

## 2024-04-14 DIAGNOSIS — Z950 Presence of cardiac pacemaker: Secondary | ICD-10-CM | POA: Diagnosis not present

## 2024-04-14 DIAGNOSIS — I442 Atrioventricular block, complete: Secondary | ICD-10-CM | POA: Diagnosis not present

## 2024-04-16 DIAGNOSIS — I739 Peripheral vascular disease, unspecified: Secondary | ICD-10-CM | POA: Diagnosis not present

## 2024-04-16 DIAGNOSIS — I70201 Unspecified atherosclerosis of native arteries of extremities, right leg: Secondary | ICD-10-CM | POA: Diagnosis not present

## 2024-05-02 DIAGNOSIS — D509 Iron deficiency anemia, unspecified: Secondary | ICD-10-CM | POA: Diagnosis not present

## 2024-05-02 DIAGNOSIS — L03115 Cellulitis of right lower limb: Secondary | ICD-10-CM | POA: Diagnosis not present

## 2024-05-06 DIAGNOSIS — L03115 Cellulitis of right lower limb: Secondary | ICD-10-CM | POA: Diagnosis not present

## 2024-05-07 DIAGNOSIS — L03115 Cellulitis of right lower limb: Secondary | ICD-10-CM | POA: Diagnosis not present

## 2024-05-14 DIAGNOSIS — T82897A Other specified complication of cardiac prosthetic devices, implants and grafts, initial encounter: Secondary | ICD-10-CM | POA: Diagnosis not present

## 2024-05-14 DIAGNOSIS — I429 Cardiomyopathy, unspecified: Secondary | ICD-10-CM | POA: Diagnosis not present
